# Patient Record
Sex: Female | Born: 1935 | Race: White | Hispanic: No | Marital: Single | Smoking: Never smoker
Health system: Southern US, Community
[De-identification: ages and names within clinical notes are randomized; demographics above are authoritative.]

## PROBLEM LIST (undated history)

## (undated) DIAGNOSIS — M6282 Rhabdomyolysis: Secondary | ICD-10-CM

## (undated) DIAGNOSIS — L8990 Pressure ulcer of unspecified site, unspecified stage: Secondary | ICD-10-CM

## (undated) DIAGNOSIS — R7989 Other specified abnormal findings of blood chemistry: Secondary | ICD-10-CM

## (undated) DIAGNOSIS — R011 Cardiac murmur, unspecified: Secondary | ICD-10-CM

## (undated) DIAGNOSIS — J189 Pneumonia, unspecified organism: Secondary | ICD-10-CM

## (undated) DIAGNOSIS — D649 Anemia, unspecified: Secondary | ICD-10-CM

## (undated) DIAGNOSIS — R9431 Abnormal electrocardiogram [ECG] [EKG]: Secondary | ICD-10-CM

## (undated) DIAGNOSIS — Z9181 History of falling: Secondary | ICD-10-CM

## (undated) HISTORY — DX: Other specified abnormal findings of blood chemistry: R79.89

## (undated) HISTORY — DX: Cardiac murmur, unspecified: R01.1

## (undated) HISTORY — DX: Anemia, unspecified: D64.9

## (undated) HISTORY — DX: Pneumonia, unspecified organism: J18.9

## (undated) HISTORY — DX: Abnormal electrocardiogram (ECG) (EKG): R94.31

## (undated) HISTORY — DX: Pressure ulcer of unspecified site, unspecified stage: L89.90

## (undated) HISTORY — DX: History of falling: Z91.81

## (undated) HISTORY — PX: OOPHORECTOMY: SHX86

## (undated) HISTORY — DX: Rhabdomyolysis: M62.82

## (undated) HISTORY — PX: CHOLECYSTECTOMY: SHX55

---

## 2016-03-03 ENCOUNTER — Inpatient Hospital Stay (HOSPITAL_COMMUNITY)
Admission: EM | Admit: 2016-03-03 | Discharge: 2016-03-07 | DRG: 564 | Disposition: A | Discharge status: Skilled Nursing Facility | Payer: Medicare Other | Attending: Internal Medicine | Admitting: Internal Medicine

## 2016-03-03 ENCOUNTER — Emergency Department (HOSPITAL_COMMUNITY): Payer: Medicare Other

## 2016-03-03 ENCOUNTER — Encounter (HOSPITAL_COMMUNITY): Payer: Self-pay | Admitting: Emergency Medicine

## 2016-03-03 DIAGNOSIS — J181 Lobar pneumonia, unspecified organism: Secondary | ICD-10-CM

## 2016-03-03 DIAGNOSIS — Z7982 Long term (current) use of aspirin: Secondary | ICD-10-CM

## 2016-03-03 DIAGNOSIS — L03116 Cellulitis of left lower limb: Secondary | ICD-10-CM | POA: Diagnosis present

## 2016-03-03 DIAGNOSIS — R9431 Abnormal electrocardiogram [ECG] [EKG]: Secondary | ICD-10-CM | POA: Diagnosis present

## 2016-03-03 DIAGNOSIS — Z9181 History of falling: Secondary | ICD-10-CM

## 2016-03-03 DIAGNOSIS — N895 Stricture and atresia of vagina: Secondary | ICD-10-CM | POA: Diagnosis present

## 2016-03-03 DIAGNOSIS — L309 Dermatitis, unspecified: Secondary | ICD-10-CM | POA: Diagnosis present

## 2016-03-03 DIAGNOSIS — J189 Pneumonia, unspecified organism: Secondary | ICD-10-CM

## 2016-03-03 DIAGNOSIS — Y92009 Unspecified place in unspecified non-institutional (private) residence as the place of occurrence of the external cause: Secondary | ICD-10-CM

## 2016-03-03 DIAGNOSIS — T796XXA Traumatic ischemia of muscle, initial encounter: Principal | ICD-10-CM | POA: Insufficient documentation

## 2016-03-03 DIAGNOSIS — R339 Retention of urine, unspecified: Secondary | ICD-10-CM | POA: Diagnosis present

## 2016-03-03 DIAGNOSIS — I248 Other forms of acute ischemic heart disease: Secondary | ICD-10-CM | POA: Diagnosis present

## 2016-03-03 DIAGNOSIS — R32 Unspecified urinary incontinence: Secondary | ICD-10-CM | POA: Diagnosis present

## 2016-03-03 DIAGNOSIS — I5033 Acute on chronic diastolic (congestive) heart failure: Secondary | ICD-10-CM | POA: Diagnosis present

## 2016-03-03 DIAGNOSIS — R0902 Hypoxemia: Secondary | ICD-10-CM

## 2016-03-03 DIAGNOSIS — Z9049 Acquired absence of other specified parts of digestive tract: Secondary | ICD-10-CM

## 2016-03-03 DIAGNOSIS — D649 Anemia, unspecified: Secondary | ICD-10-CM | POA: Diagnosis present

## 2016-03-03 DIAGNOSIS — M6282 Rhabdomyolysis: Secondary | ICD-10-CM | POA: Diagnosis present

## 2016-03-03 DIAGNOSIS — E876 Hypokalemia: Secondary | ICD-10-CM | POA: Diagnosis present

## 2016-03-03 DIAGNOSIS — S40211A Abrasion of right shoulder, initial encounter: Secondary | ICD-10-CM | POA: Diagnosis present

## 2016-03-03 DIAGNOSIS — D638 Anemia in other chronic diseases classified elsewhere: Secondary | ICD-10-CM | POA: Diagnosis present

## 2016-03-03 DIAGNOSIS — R778 Other specified abnormalities of plasma proteins: Secondary | ICD-10-CM | POA: Diagnosis present

## 2016-03-03 DIAGNOSIS — H9190 Unspecified hearing loss, unspecified ear: Secondary | ICD-10-CM | POA: Diagnosis present

## 2016-03-03 DIAGNOSIS — L8990 Pressure ulcer of unspecified site, unspecified stage: Secondary | ICD-10-CM | POA: Insufficient documentation

## 2016-03-03 DIAGNOSIS — I272 Other secondary pulmonary hypertension: Secondary | ICD-10-CM | POA: Diagnosis present

## 2016-03-03 DIAGNOSIS — R7989 Other specified abnormal findings of blood chemistry: Secondary | ICD-10-CM | POA: Diagnosis present

## 2016-03-03 DIAGNOSIS — L89152 Pressure ulcer of sacral region, stage 2: Secondary | ICD-10-CM | POA: Diagnosis present

## 2016-03-03 DIAGNOSIS — J9601 Acute respiratory failure with hypoxia: Secondary | ICD-10-CM | POA: Diagnosis present

## 2016-03-03 DIAGNOSIS — W19XXXA Unspecified fall, initial encounter: Secondary | ICD-10-CM | POA: Diagnosis present

## 2016-03-03 DIAGNOSIS — L89113 Pressure ulcer of right upper back, stage 3: Secondary | ICD-10-CM | POA: Diagnosis present

## 2016-03-03 DIAGNOSIS — M25552 Pain in left hip: Secondary | ICD-10-CM | POA: Diagnosis not present

## 2016-03-03 DIAGNOSIS — I350 Nonrheumatic aortic (valve) stenosis: Secondary | ICD-10-CM | POA: Diagnosis present

## 2016-03-03 DIAGNOSIS — L89892 Pressure ulcer of other site, stage 2: Secondary | ICD-10-CM | POA: Diagnosis present

## 2016-03-03 DIAGNOSIS — T7601XA Adult neglect or abandonment, suspected, initial encounter: Secondary | ICD-10-CM

## 2016-03-03 HISTORY — DX: Pneumonia, unspecified organism: J18.9

## 2016-03-03 MED ORDER — SODIUM CHLORIDE 0.9 % IV BOLUS (SEPSIS)
1000.0000 mL | Freq: Once | INTRAVENOUS | Status: AC
  Administered 2016-03-04: 1000 mL via INTRAVENOUS

## 2016-03-03 NOTE — ED Notes (Signed)
Bed: [id number] Expected date:  Expected time:  Means of arrival:  Comments: EMS 82yo F left arm pain / fall

## 2016-03-03 NOTE — ED Notes (Addendum)
Pt from home via EMS following a fall 2 days ago. Pt states that she lives by herself. 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 They state that [employer] will be in as this will be an APS case. Pt states that her daughter checks on her occasionally. Pt was found by an [employer] employee. Pt only has complaints of left arm pain. No deformity or swelling identified in the area. Pt can move the extremity without grimacing. Pt has no known medical history and EMS states that there were no medications in the home. Per EMS the home was infested with large bugs  Pt has large bruise on right leg. Pt has small abrasion on right shoulder

## 2016-03-03 NOTE — ED Notes (Signed)
PT taken to XRAY

## 2016-03-03 NOTE — ED Provider Notes (Addendum)
CSN: [id number]     Arrival date & time 03/03/16  2102 History  By signing my name below, I, [facility], attest that this documentation has been prepared under the direction and in the presence of Rontavious Albright, MD. Electronically Signed: Randell Patient, ED Scribe. 03/04/2016. 3:34 AM.   Chief Complaint  Patient presents with  . Fall    Patient is a 82 y.o. female presenting with fall. The history is provided by the patient and a relative. No language interpreter was used.  Fall This is a new problem. The current episode started more than 2 days ago. The problem occurs constantly. The problem has not changed since onset.Pertinent negatives include no chest pain. Nothing aggravates the symptoms. Nothing relieves the symptoms. She has tried nothing for the symptoms. The treatment provided no relief.   HPI Comments: Laura Sanchez is a 82 y.o. female with no pertinent chronic conditions who presents to the Emergency Department complaining of constant, mild neck pain and hip pain after a fall that occurred 3 days ago. Daughter reports that the pt fell 3 days ago and was found by the [employer] repairman earlier today. Daughter states that the pt was able to drag herself to next to an A/C vent and had a jug of water with her but that she has not eaten any food since the fall. Pt reports associated left arm pain, left neck pain, and left hip pain. Denies any other symptoms currently.  History reviewed. No pertinent past medical history. History reviewed. No pertinent past surgical history. No family history on file. Social History  Substance Use Topics  . Smoking status: Never Smoker   . Smokeless tobacco: None  . Alcohol Use: No   OB History    No data available     Review of Systems  Cardiovascular: Negative for chest pain.  Musculoskeletal: Positive for myalgias, arthralgias and neck pain.  All other systems reviewed and are negative.     Allergies  Review of patient's  allergies indicates no known allergies.  Home Medications   Prior to Admission medications   Medication Sig Start Date End Date Taking? Authorizing Provider  aspirin EC 325 MG tablet Take 325 mg by mouth daily as needed for moderate pain.   Yes Historical Provider, MD  Ca Phosphate-Cholecalciferol (CALTRATE GUMMY BITES) 250-400 MG-UNIT CHEW Chew 2 each by mouth daily.   Yes Historical Provider, MD  Menthol, Topical Analgesic, (BENGAY EX) Apply 1 application topically daily as needed (pain.).   Yes Historical Provider, MD  Multiple Vitamins-Minerals (MULTIVITAMIN WITH IRON-MINERALS) liquid Take 15 mLs by mouth daily.   Yes Historical Provider, MD   BP 150/59 mmHg  Pulse 60  Temp(Src) 97.5 F (36.4 C) (Oral)  Resp 13  SpO2 97% Physical Exam  Constitutional: She is oriented to person, place, and time. She appears well-developed and well-nourished. No distress.  HENT:  Head: Normocephalic and atraumatic. Head is without raccoon's eyes and without Battle's sign.  Right Ear: No hemotympanum.  Left Ear: No hemotympanum.  Mouth/Throat: Oropharynx is clear and moist and mucous membranes are normal. No oropharyngeal exudate.  Moist mucus membranes.  Eyes: EOM are normal. Pupils are equal, round, and reactive to light.  PERRL  Neck: Trachea normal and normal range of motion. Neck supple. Carotid bruit is not present. No tracheal deviation present.  No stridor or bruit. Trachea midline.  Cardiovascular: Normal rate, regular rhythm and intact distal pulses.   Regular rate and rhythm. Intact DP pulses.  Pulmonary/Chest: Effort  normal and breath sounds normal. No stridor. No respiratory distress. She has no wheezes. She has no rales.  Lungs CTA bilaterally.  Abdominal: Soft. Bowel sounds are normal. She exhibits no distension. There is no rebound and no guarding.  Bowel sounds normal. No guarding or rebound.  Musculoskeletal: Normal range of motion. She exhibits no edema.  Pelvis stable. No  external rotation for shortening. All compartments soft.  Neurological: She is alert and oriented to person, place, and time. She has normal reflexes.  Skin: Skin is warm and dry. She is not diaphoretic.  Early cellultis on left shin. No bruising on upper extremities.  Psychiatric: She has a normal mood and affect. Her behavior is normal.  Nursing note and vitals reviewed.   ED Course  Procedures (including critical care time)  DIAGNOSTIC STUDIES: Oxygen Saturation is 97% on RA, normal by my interpretation.    COORDINATION OF CARE: 11:23 PM Will order IV fluids, labs, CT scan of head and c-spine, and x-rays of left arm, left hip, and chest. Discussed treatment plan with pt at bedside and pt agreed to plan.  3:28 AM Spoke with pt's nurse who stated that 4 ED staff members have unsuccessfully attempted to in and out cath the pt.  Labs Review Labs Reviewed  CBC WITH DIFFERENTIAL/PLATELET - Abnormal; Notable for the following:    WBC 13.9 (*)    RBC 3.00 (*)    Hemoglobin 9.4 (*)    HCT 27.6 (*)    RDW 15.6 (*)    Neutro Abs 11.6 (*)    Monocytes Absolute 1.5 (*)    All other components within normal limits  CK TOTAL AND CKMB (NOT AT [facility]) - Abnormal; Notable for the following:    Total CK 758 (*)    CK, MB 13.9 (*)    All other components within normal limits  CK - Abnormal; Notable for the following:    Total CK 696 (*)    All other components within normal limits  I-STAT CHEM 8, ED - Abnormal; Notable for the following:    Sodium 133 (*)    Chloride 91 (*)    BUN 25 (*)    Calcium, Ion 1.07 (*)    Hemoglobin 9.5 (*)    HCT 28.0 (*)    All other components within normal limits  I-STAT TROPOININ, ED - Abnormal; Notable for the following:    Troponin i, poc 0.17 (*)    All other components within normal limits  URINALYSIS, ROUTINE W REFLEX MICROSCOPIC (NOT AT [facility])    Imaging Review Dg Chest 2 View  03/04/2016  CLINICAL DATA:  Status post fall, with pain at the left  humerus. Initial encounter. EXAM: CHEST  2 VIEW COMPARISON:  None. FINDINGS: A moderate right-sided pleural effusion is noted, with patchy bilateral airspace opacity. This may reflect pneumonia or pulmonary edema. No pneumothorax is seen. The cardiomediastinal silhouette remains normal in size. No acute osseous abnormalities are identified. There is diffuse osteopenia of visualized osseous structures. Clips are noted within the right upper quadrant, reflecting prior cholecystectomy. IMPRESSION: Moderate right-sided pleural effusion, with patchy bilateral airspace opacity. This may reflect pneumonia or pulmonary edema. Electronically Signed   By: Roanna RaiderJeffery  Chang M.D.   On: 03/04/2016 00:35   Ct Head Wo Contrast  03/04/2016  CLINICAL DATA:  82 year old female with fall EXAM: CT HEAD WITHOUT CONTRAST CT CERVICAL SPINE WITHOUT CONTRAST TECHNIQUE: Multidetector CT imaging of the head and cervical spine was performed following the standard protocol  without intravenous contrast. Multiplanar CT image reconstructions of the cervical spine were also generated. COMPARISON:  None. FINDINGS: CT HEAD FINDINGS The ventricles and sulci are mildly prominent compatible with mild age-related atrophy. Mild moderate periventricular and deep white matter chronic microvascular ischemic changes noted. There is no acute intracranial hemorrhage. No mass effect or midline shift noted. The visualized paranasal sinuses and mastoid air cells are clear. The calvarium is intact. CT CERVICAL SPINE FINDINGS There is no acute fracture or subluxation of the cervical spine.There is osteopenia with multilevel degenerative changes of the spine.The odontoid and spinous processes are intact.There is normal anatomic alignment of the C1-C2 lateral masses. The visualized soft tissues appear unremarkable. Partially visualized right and probable left pleural effusions. There is biapical scarring with increased interstitial septal prominence likely a degree of  edema. IMPRESSION: No acute intracranial hemorrhage. No acute/ traumatic cervical spine pathology. Electronically Signed   By: Elgie Collard M.D.   On: 03/04/2016 00:50   Ct Cervical Spine Wo Contrast  03/04/2016  CLINICAL DATA:  82 year old female with fall EXAM: CT HEAD WITHOUT CONTRAST CT CERVICAL SPINE WITHOUT CONTRAST TECHNIQUE: Multidetector CT imaging of the head and cervical spine was performed following the standard protocol without intravenous contrast. Multiplanar CT image reconstructions of the cervical spine were also generated. COMPARISON:  None. FINDINGS: CT HEAD FINDINGS The ventricles and sulci are mildly prominent compatible with mild age-related atrophy. Mild moderate periventricular and deep white matter chronic microvascular ischemic changes noted. There is no acute intracranial hemorrhage. No mass effect or midline shift noted. The visualized paranasal sinuses and mastoid air cells are clear. The calvarium is intact. CT CERVICAL SPINE FINDINGS There is no acute fracture or subluxation of the cervical spine.There is osteopenia with multilevel degenerative changes of the spine.The odontoid and spinous processes are intact.There is normal anatomic alignment of the C1-C2 lateral masses. The visualized soft tissues appear unremarkable. Partially visualized right and probable left pleural effusions. There is biapical scarring with increased interstitial septal prominence likely a degree of edema. IMPRESSION: No acute intracranial hemorrhage. No acute/ traumatic cervical spine pathology. Electronically Signed   By: Elgie Collard M.D.   On: 03/04/2016 00:50   Dg Humerus Left  03/04/2016  CLINICAL DATA:  82 year old female with fall and left upper extremity pain. EXAM: LEFT HUMERUS - 2+ VIEW COMPARISON:  None. FINDINGS: No definite acute fracture or dislocation identified. There is advanced osteopenia which limits evaluation for fracture. There is degenerative changes of the shoulder. The  soft tissues appear unremarkable. No radiopaque foreign object identified. IMPRESSION: No acute fracture or dislocation. Electronically Signed   By: Elgie Collard M.D.   On: 03/04/2016 00:36   Dg Hip Unilat With Pelvis 2-3 Views Left  03/04/2016  CLINICAL DATA:  Status post fall, with left hip pain. Initial encounter. EXAM: DG HIP (WITH OR WITHOUT PELVIS) 2-3V LEFT COMPARISON:  None. FINDINGS: There is no evidence of fracture or dislocation. Both femoral heads are seated normally within their respective acetabula. The proximal left femur appears intact. No significant degenerative change is appreciated. The sacroiliac joints are unremarkable in appearance. The visualized bowel gas pattern is grossly unremarkable in appearance. IMPRESSION: No evidence of fracture or dislocation. Electronically Signed   By: Roanna Raider M.D.   On: 03/04/2016 00:36   I have personally reviewed and evaluated these images and lab results as part of my medical decision-making.   EKG Interpretation   Date/Time:  Saturday March 04 2016 00:17:01 EDT Ventricular Rate:  61 PR  Interval:    QRS Duration: 80 QT Interval:  620 QTC Calculation: 625 R Axis:   99 Text Interpretation:  Sinus rhythm Atrial premature complexes Probable  anteroseptal infarct, old Prolonged QT interval Baseline wander in lead(s)  V4 V5 Confirmed by [facility]  MD, Royalti Schauf ([id number]) on 03/04/2016 1:22:49  AM      MDM   Final diagnoses:  Fall   Filed Vitals:   03/04/16 0330 03/04/16 0334  BP: 164/73   Pulse:  70  Temp:    Resp:  20   Results for orders placed or performed during the hospital encounter of 03/03/16  CBC with Differential/Platelet  Result Value Ref Range   WBC 13.9 (H) 4.0 - 10.5 K/uL   RBC 3.00 (L) 3.87 - 5.11 MIL/uL   Hemoglobin 9.4 (L) 12.0 - 15.0 g/dL   HCT 60.4 (L) 54.0 - 98.1 %   MCV 92.0 78.0 - 100.0 fL   MCH 31.3 26.0 - 34.0 pg   MCHC 34.1 30.0 - 36.0 g/dL   RDW 19.1 (H) 47.8 - 29.5 %   Platelets 292 150  - 400 K/uL   Neutrophils Relative % 84 %   Neutro Abs 11.6 (H) 1.7 - 7.7 K/uL   Lymphocytes Relative 5 %   Lymphs Abs 0.7 0.7 - 4.0 K/uL   Monocytes Relative 11 %   Monocytes Absolute 1.5 (H) 0.1 - 1.0 K/uL   Eosinophils Relative 0 %   Eosinophils Absolute 0.0 0.0 - 0.7 K/uL   Basophils Relative 0 %   Basophils Absolute 0.0 0.0 - 0.1 K/uL  Urinalysis, Routine w reflex microscopic (not at [facility])  Result Value Ref Range   Color, Urine AMBER (A) YELLOW   APPearance CLEAR CLEAR   Specific Gravity, Urine 1.019 1.005 - 1.030   pH 6.5 5.0 - 8.0   Glucose, UA NEGATIVE NEGATIVE mg/dL   Hgb urine dipstick MODERATE (A) NEGATIVE   Bilirubin Urine SMALL (A) NEGATIVE   Ketones, ur 40 (A) NEGATIVE mg/dL   Protein, ur NEGATIVE NEGATIVE mg/dL   Nitrite NEGATIVE NEGATIVE   Leukocytes, UA SMALL (A) NEGATIVE  CK total and CKMB (cardiac)not at [facility]  Result Value Ref Range   Total CK 758 (H) 38 - 234 U/L   CK, MB 13.9 (H) 0.5 - 5.0 ng/mL   Relative Index 1.8 0.0 - 2.5  CK  Result Value Ref Range   Total CK 696 (H) 38 - 234 U/L  Urine microscopic-add on  Result Value Ref Range   Squamous Epithelial / LPF 0-5 (A) NONE SEEN   WBC, UA 0-5 0 - 5 WBC/hpf   RBC / HPF 6-30 0 - 5 RBC/hpf   Bacteria, UA RARE (A) NONE SEEN  I-Stat Chem 8, ED  Result Value Ref Range   Sodium 133 (L) 135 - 145 mmol/L   Potassium 3.5 3.5 - 5.1 mmol/L   Chloride 91 (L) 101 - 111 mmol/L   BUN 25 (H) 6 - 20 mg/dL   Creatinine, Ser 6.21 0.44 - 1.00 mg/dL   Glucose, Bld 96 65 - 99 mg/dL   Calcium, Ion 3.08 (L) 1.13 - 1.30 mmol/L   TCO2 28 0 - 100 mmol/L   Hemoglobin 9.5 (L) 12.0 - 15.0 g/dL   HCT 65.7 (L) 84.6 - 96.2 %  I-stat troponin, ED  Result Value Ref Range   Troponin i, poc 0.17 (HH) 0.00 - 0.08 ng/mL   Comment NOTIFIED PHYSICIAN    Comment 3  Dg Chest 2 View  03/04/2016  CLINICAL DATA:  Status post fall, with pain at the left humerus. Initial encounter. EXAM: CHEST  2 VIEW COMPARISON:  None. FINDINGS:  A moderate right-sided pleural effusion is noted, with patchy bilateral airspace opacity. This may reflect pneumonia or pulmonary edema. No pneumothorax is seen. The cardiomediastinal silhouette remains normal in size. No acute osseous abnormalities are identified. There is diffuse osteopenia of visualized osseous structures. Clips are noted within the right upper quadrant, reflecting prior cholecystectomy. IMPRESSION: Moderate right-sided pleural effusion, with patchy bilateral airspace opacity. This may reflect pneumonia or pulmonary edema. Electronically Signed   By: Roanna Raider M.D.   On: 03/04/2016 00:35   Ct Head Wo Contrast  03/04/2016  CLINICAL DATA:  82 year old female with fall EXAM: CT HEAD WITHOUT CONTRAST CT CERVICAL SPINE WITHOUT CONTRAST TECHNIQUE: Multidetector CT imaging of the head and cervical spine was performed following the standard protocol without intravenous contrast. Multiplanar CT image reconstructions of the cervical spine were also generated. COMPARISON:  None. FINDINGS: CT HEAD FINDINGS The ventricles and sulci are mildly prominent compatible with mild age-related atrophy. Mild moderate periventricular and deep white matter chronic microvascular ischemic changes noted. There is no acute intracranial hemorrhage. No mass effect or midline shift noted. The visualized paranasal sinuses and mastoid air cells are clear. The calvarium is intact. CT CERVICAL SPINE FINDINGS There is no acute fracture or subluxation of the cervical spine.There is osteopenia with multilevel degenerative changes of the spine.The odontoid and spinous processes are intact.There is normal anatomic alignment of the C1-C2 lateral masses. The visualized soft tissues appear unremarkable. Partially visualized right and probable left pleural effusions. There is biapical scarring with increased interstitial septal prominence likely a degree of edema. IMPRESSION: No acute intracranial hemorrhage. No acute/ traumatic  cervical spine pathology. Electronically Signed   By: Elgie Collard M.D.   On: 03/04/2016 00:50   Ct Cervical Spine Wo Contrast  03/04/2016  CLINICAL DATA:  82 year old female with fall EXAM: CT HEAD WITHOUT CONTRAST CT CERVICAL SPINE WITHOUT CONTRAST TECHNIQUE: Multidetector CT imaging of the head and cervical spine was performed following the standard protocol without intravenous contrast. Multiplanar CT image reconstructions of the cervical spine were also generated. COMPARISON:  None. FINDINGS: CT HEAD FINDINGS The ventricles and sulci are mildly prominent compatible with mild age-related atrophy. Mild moderate periventricular and deep white matter chronic microvascular ischemic changes noted. There is no acute intracranial hemorrhage. No mass effect or midline shift noted. The visualized paranasal sinuses and mastoid air cells are clear. The calvarium is intact. CT CERVICAL SPINE FINDINGS There is no acute fracture or subluxation of the cervical spine.There is osteopenia with multilevel degenerative changes of the spine.The odontoid and spinous processes are intact.There is normal anatomic alignment of the C1-C2 lateral masses. The visualized soft tissues appear unremarkable. Partially visualized right and probable left pleural effusions. There is biapical scarring with increased interstitial septal prominence likely a degree of edema. IMPRESSION: No acute intracranial hemorrhage. No acute/ traumatic cervical spine pathology. Electronically Signed   By: Elgie Collard M.D.   On: 03/04/2016 00:50   Dg Humerus Left  03/04/2016  CLINICAL DATA:  82 year old female with fall and left upper extremity pain. EXAM: LEFT HUMERUS - 2+ VIEW COMPARISON:  None. FINDINGS: No definite acute fracture or dislocation identified. There is advanced osteopenia which limits evaluation for fracture. There is degenerative changes of the shoulder. The soft tissues appear unremarkable. No radiopaque foreign object identified.  IMPRESSION: No acute fracture or  dislocation. Electronically Signed   By: Elgie CollardArash  Radparvar M.D.   On: 03/04/2016 00:36   Dg Hip Unilat With Pelvis 2-3 Views Left  03/04/2016  CLINICAL DATA:  Status post fall, with left hip pain. Initial encounter. EXAM: DG HIP (WITH OR WITHOUT PELVIS) 2-3V LEFT COMPARISON:  None. FINDINGS: There is no evidence of fracture or dislocation. Both femoral heads are seated normally within their respective acetabula. The proximal left femur appears intact. No significant degenerative change is appreciated. The sacroiliac joints are unremarkable in appearance. The visualized bowel gas pattern is grossly unremarkable in appearance. IMPRESSION: No evidence of fracture or dislocation. Electronically Signed   By: Roanna RaiderJeffery  Chang M.D.   On: 03/04/2016 00:36    Medications  sodium chloride 0.9 % bolus 1,000 mL (0 mLs Intravenous Stopped 03/04/16 0334)  cefTRIAXone (ROCEPHIN) 1 g in dextrose 5 % 50 mL IVPB (0 g Intravenous Stopped 03/04/16 0333)  azithromycin (ZITHROMAX) 500 mg in dextrose 5 % 250 mL IVPB (500 mg Intravenous New Bag/Given 03/04/16 0333)    EKG Interpretation  Date/Time:  Saturday March 04 2016 00:17:01 EDT Ventricular Rate:  61 PR Interval:    QRS Duration: 80 QT Interval:  620 QTC Calculation: 625 R Axis:   99 Text Interpretation:  Sinus rhythm Atrial premature complexes Probable anteroseptal infarct, old Prolonged QT interval Baseline wander in lead(s) V4 V5 Confirmed by [facility]  [facility], Keontre Defino ([id number]) on 03/04/2016 1:22:49 AM       Will admit to inpatient.  Will need social work consult for placement   I personally performed the services described in this documentation, which was scribed in my presence. The recorded information has been reviewed and is accurate.      Cy BlamerApril Rasheed Welty, MD 03/04/16 0435  Shatonia Hoots, MD 03/04/16 [id number]

## 2016-03-03 NOTE — Progress Notes (Signed)
CSW met with patient at bedside. Patient was alert and oriented. Daughter was present. Patient confirms that she presents to [facility] due to fall. Patient states that she lives home alone and has been on the floor since she fell Wednesday. Patient informed CSW that she that she does not usually fall often. Patient states that she fell x2 within the past 6 months, and those 2 times were last Friday and Wednesday.  Daughter reports that patient has a good memory and is high functioning. Daughter states that pt has her own daily routine. Daughter states " She was doing great. She's more alert than I am". CSW questioned daughter about patient's living conditions. Daughter states that pt has a clear walk way, and patient states that she did not trip on anything , and states that it was something on her shoe. Daughter informed CSW that pt has heat, air, food, and water in her home. Daughter states that she is the patient's primary support.  Daughter states that she visits the patient once a week, usually on Tuesday and that patient's brother visits her x2 a week. Patient states that prior to fall she has been completing her ADL's independently prior to this incident.   Per note, EMS states that pt's home was infested with bugs and pt is a Ship broker. 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  Daughter/Jean Trapp [phone]  Willette Brace [phone] ED CSW 03/03/2016 10:59 PM

## 2016-03-03 NOTE — ED Notes (Signed)
Orlean PattenJean Tripp (daughter) [phone] Call in case of emergency or for updates.

## 2016-03-04 ENCOUNTER — Observation Stay (HOSPITAL_COMMUNITY): Payer: Medicare Other

## 2016-03-04 ENCOUNTER — Other Ambulatory Visit: Payer: Self-pay

## 2016-03-04 ENCOUNTER — Encounter (HOSPITAL_COMMUNITY): Payer: Self-pay | Admitting: Emergency Medicine

## 2016-03-04 ENCOUNTER — Inpatient Hospital Stay (HOSPITAL_COMMUNITY): Payer: Medicare Other

## 2016-03-04 DIAGNOSIS — Z7982 Long term (current) use of aspirin: Secondary | ICD-10-CM | POA: Diagnosis not present

## 2016-03-04 DIAGNOSIS — D649 Anemia, unspecified: Secondary | ICD-10-CM | POA: Diagnosis not present

## 2016-03-04 DIAGNOSIS — R339 Retention of urine, unspecified: Secondary | ICD-10-CM | POA: Diagnosis present

## 2016-03-04 DIAGNOSIS — L309 Dermatitis, unspecified: Secondary | ICD-10-CM | POA: Diagnosis present

## 2016-03-04 DIAGNOSIS — L89152 Pressure ulcer of sacral region, stage 2: Secondary | ICD-10-CM | POA: Diagnosis present

## 2016-03-04 DIAGNOSIS — W19XXXA Unspecified fall, initial encounter: Secondary | ICD-10-CM | POA: Diagnosis present

## 2016-03-04 DIAGNOSIS — R7989 Other specified abnormal findings of blood chemistry: Secondary | ICD-10-CM | POA: Diagnosis not present

## 2016-03-04 DIAGNOSIS — L89113 Pressure ulcer of right upper back, stage 3: Secondary | ICD-10-CM | POA: Diagnosis present

## 2016-03-04 DIAGNOSIS — I350 Nonrheumatic aortic (valve) stenosis: Secondary | ICD-10-CM

## 2016-03-04 DIAGNOSIS — L8990 Pressure ulcer of unspecified site, unspecified stage: Secondary | ICD-10-CM | POA: Diagnosis not present

## 2016-03-04 DIAGNOSIS — Z9049 Acquired absence of other specified parts of digestive tract: Secondary | ICD-10-CM | POA: Diagnosis not present

## 2016-03-04 DIAGNOSIS — Y92009 Unspecified place in unspecified non-institutional (private) residence as the place of occurrence of the external cause: Secondary | ICD-10-CM | POA: Diagnosis not present

## 2016-03-04 DIAGNOSIS — D638 Anemia in other chronic diseases classified elsewhere: Secondary | ICD-10-CM | POA: Diagnosis present

## 2016-03-04 DIAGNOSIS — I5033 Acute on chronic diastolic (congestive) heart failure: Secondary | ICD-10-CM | POA: Diagnosis present

## 2016-03-04 DIAGNOSIS — N895 Stricture and atresia of vagina: Secondary | ICD-10-CM | POA: Diagnosis present

## 2016-03-04 DIAGNOSIS — R9431 Abnormal electrocardiogram [ECG] [EKG]: Secondary | ICD-10-CM | POA: Diagnosis present

## 2016-03-04 DIAGNOSIS — E876 Hypokalemia: Secondary | ICD-10-CM | POA: Diagnosis present

## 2016-03-04 DIAGNOSIS — R32 Unspecified urinary incontinence: Secondary | ICD-10-CM | POA: Diagnosis present

## 2016-03-04 DIAGNOSIS — I248 Other forms of acute ischemic heart disease: Secondary | ICD-10-CM | POA: Diagnosis present

## 2016-03-04 DIAGNOSIS — M6282 Rhabdomyolysis: Secondary | ICD-10-CM | POA: Diagnosis present

## 2016-03-04 DIAGNOSIS — J9601 Acute respiratory failure with hypoxia: Secondary | ICD-10-CM | POA: Diagnosis present

## 2016-03-04 DIAGNOSIS — Z9181 History of falling: Secondary | ICD-10-CM | POA: Diagnosis not present

## 2016-03-04 DIAGNOSIS — T796XXA Traumatic ischemia of muscle, initial encounter: Secondary | ICD-10-CM | POA: Diagnosis present

## 2016-03-04 DIAGNOSIS — I272 Other secondary pulmonary hypertension: Secondary | ICD-10-CM | POA: Diagnosis present

## 2016-03-04 DIAGNOSIS — R778 Other specified abnormalities of plasma proteins: Secondary | ICD-10-CM | POA: Diagnosis present

## 2016-03-04 DIAGNOSIS — J189 Pneumonia, unspecified organism: Secondary | ICD-10-CM | POA: Diagnosis present

## 2016-03-04 DIAGNOSIS — H9190 Unspecified hearing loss, unspecified ear: Secondary | ICD-10-CM | POA: Diagnosis present

## 2016-03-04 DIAGNOSIS — S40211A Abrasion of right shoulder, initial encounter: Secondary | ICD-10-CM | POA: Diagnosis present

## 2016-03-04 DIAGNOSIS — T7601XA Adult neglect or abandonment, suspected, initial encounter: Secondary | ICD-10-CM | POA: Diagnosis present

## 2016-03-04 DIAGNOSIS — M25552 Pain in left hip: Secondary | ICD-10-CM | POA: Diagnosis present

## 2016-03-04 DIAGNOSIS — I4581 Long QT syndrome: Secondary | ICD-10-CM

## 2016-03-04 DIAGNOSIS — L03116 Cellulitis of left lower limb: Secondary | ICD-10-CM | POA: Diagnosis present

## 2016-03-04 DIAGNOSIS — L89892 Pressure ulcer of other site, stage 2: Secondary | ICD-10-CM | POA: Diagnosis present

## 2016-03-04 HISTORY — DX: Rhabdomyolysis: M62.82

## 2016-03-04 HISTORY — DX: Pressure ulcer of unspecified site, unspecified stage: L89.90

## 2016-03-04 HISTORY — DX: Abnormal electrocardiogram (ECG) (EKG): R94.31

## 2016-03-04 HISTORY — DX: Other specified abnormalities of plasma proteins: R77.8

## 2016-03-04 LAB — CBC
HCT: 28.7 % — ABNORMAL LOW (ref 36.0–46.0)
HEMOGLOBIN: 9.7 g/dL — AB (ref 12.0–15.0)
MCH: 30.3 pg (ref 26.0–34.0)
MCHC: 33.8 g/dL (ref 30.0–36.0)
MCV: 89.7 fL (ref 78.0–100.0)
PLATELETS: 298 10*3/uL (ref 150–400)
RBC: 3.2 MIL/uL — AB (ref 3.87–5.11)
RDW: 15.5 % (ref 11.5–15.5)
WBC: 12.1 10*3/uL — ABNORMAL HIGH (ref 4.0–10.5)

## 2016-03-04 LAB — ECHOCARDIOGRAM COMPLETE
AV Area VTI: 0.53 cm2
AV Mean grad: 74 mmHg
AV Peak grad: 122 mmHg
AV area mean vel ind: 0.34 cm2/m2
AVAREAMEANV: 0.51 cm2
AVAREAVTIIND: 0.32 cm2/m2
AVCELMEANRAT: 0.16
AVPKVEL: 553 cm/s
Ao pk vel: 0.17 m/s
CHL CUP AV PEAK INDEX: 0.36
CHL CUP AV VEL: 0.47
CHL CUP DOP CALC LVOT VTI: 20.7 cm
CHL CUP TV REG PEAK VELOCITY: 368 cm/s
DOP CAL AO MEAN VELOCITY: 404 cm/s
EERAT: 13.48
EWDT: 182 ms
FS: 27 % — AB (ref 28–44)
HEIGHTINCHES: 64 in
IVS/LV PW RATIO, ED: 0.72
LA ID, A-P, ES: 33 mm
LA diam end sys: 33 mm
LA diam index: 2.21 cm/m2
LA vol A4C: 97.1 ml
LA vol index: 57.8 mL/m2
LA vol: 86.1 mL
LDCA: 3.14 cm2
LV TDI E'LATERAL: 8.16
LV TDI E'MEDIAL: 7.07
LV e' LATERAL: 8.16 cm/s
LVEEAVG: 13.48
LVEEMED: 13.48
LVOT SV: 65 mL
LVOTD: 20 mm
LVOTPV: 94.1 cm/s
LVOTVTI: 0.15 cm
MV Dec: 182
MV Peak grad: 5 mmHg
MV VTI: 251 cm
MV pk A vel: 118 m/s
MVPKEVEL: 110 m/s
PW: 16.3 mm — AB (ref 0.6–1.1)
RV TAPSE: 22.8 mm
TRMAXVEL: 368 cm/s
VTI: 137 cm
Valve area index: 0.32
Valve area: 0.47 cm2
Weight: 1689.6 oz

## 2016-03-04 LAB — CBC WITH DIFFERENTIAL/PLATELET
BASOS ABS: 0 10*3/uL (ref 0.0–0.1)
BASOS PCT: 0 %
EOS ABS: 0 10*3/uL (ref 0.0–0.7)
EOS PCT: 0 %
HEMATOCRIT: 27.6 % — AB (ref 36.0–46.0)
Hemoglobin: 9.4 g/dL — ABNORMAL LOW (ref 12.0–15.0)
Lymphocytes Relative: 5 %
Lymphs Abs: 0.7 10*3/uL (ref 0.7–4.0)
MCH: 31.3 pg (ref 26.0–34.0)
MCHC: 34.1 g/dL (ref 30.0–36.0)
MCV: 92 fL (ref 78.0–100.0)
MONO ABS: 1.5 10*3/uL — AB (ref 0.1–1.0)
MONOS PCT: 11 %
NEUTROS ABS: 11.6 10*3/uL — AB (ref 1.7–7.7)
Neutrophils Relative %: 84 %
PLATELETS: 292 10*3/uL (ref 150–400)
RBC: 3 MIL/uL — ABNORMAL LOW (ref 3.87–5.11)
RDW: 15.6 % — AB (ref 11.5–15.5)
WBC: 13.9 10*3/uL — ABNORMAL HIGH (ref 4.0–10.5)

## 2016-03-04 LAB — COMPREHENSIVE METABOLIC PANEL
ALBUMIN: 2.8 g/dL — AB (ref 3.5–5.0)
ALT: 29 U/L (ref 14–54)
AST: 55 U/L — ABNORMAL HIGH (ref 15–41)
Alkaline Phosphatase: 45 U/L (ref 38–126)
Anion gap: 8 (ref 5–15)
BUN: 24 mg/dL — ABNORMAL HIGH (ref 6–20)
CHLORIDE: 97 mmol/L — AB (ref 101–111)
CO2: 28 mmol/L (ref 22–32)
CREATININE: 0.68 mg/dL (ref 0.44–1.00)
Calcium: 7.9 mg/dL — ABNORMAL LOW (ref 8.9–10.3)
GFR calc non Af Amer: >60 mL/min (ref >60)
GLUCOSE: 118 mg/dL — AB (ref 65–99)
Potassium: 3.1 mmol/L — ABNORMAL LOW (ref 3.5–5.1)
SODIUM: 133 mmol/L — AB (ref 135–145)
Total Bilirubin: 2.3 mg/dL — ABNORMAL HIGH (ref 0.3–1.2)
Total Protein: 5.6 g/dL — ABNORMAL LOW (ref 6.5–8.1)

## 2016-03-04 LAB — URINALYSIS, ROUTINE W REFLEX MICROSCOPIC
GLUCOSE, UA: NEGATIVE mg/dL
KETONES UR: 40 mg/dL — AB
Nitrite: NEGATIVE
PROTEIN: NEGATIVE mg/dL
Specific Gravity, Urine: 1.019 (ref 1.005–1.030)
pH: 6.5 (ref 5.0–8.0)

## 2016-03-04 LAB — TYPE AND SCREEN
ABO/RH(D): O POS
ANTIBODY SCREEN: NEGATIVE

## 2016-03-04 LAB — I-STAT CHEM 8, ED
BUN: 25 mg/dL — ABNORMAL HIGH (ref 6–20)
CREATININE: 0.9 mg/dL (ref 0.44–1.00)
Calcium, Ion: 1.07 mmol/L — ABNORMAL LOW (ref 1.13–1.30)
Chloride: 91 mmol/L — ABNORMAL LOW (ref 101–111)
Glucose, Bld: 96 mg/dL (ref 65–99)
HEMATOCRIT: 28 % — AB (ref 36.0–46.0)
HEMOGLOBIN: 9.5 g/dL — AB (ref 12.0–15.0)
POTASSIUM: 3.5 mmol/L (ref 3.5–5.1)
Sodium: 133 mmol/L — ABNORMAL LOW (ref 135–145)
TCO2: 28 mmol/L (ref 0–100)

## 2016-03-04 LAB — URINE MICROSCOPIC-ADD ON

## 2016-03-04 LAB — TSH: TSH: 4.759 u[IU]/mL — ABNORMAL HIGH (ref 0.350–4.500)

## 2016-03-04 LAB — I-STAT TROPONIN, ED: Troponin i, poc: 0.17 ng/mL (ref 0.00–0.08)

## 2016-03-04 LAB — TROPONIN I
TROPONIN I: 0.19 ng/mL — AB (ref <0.031)
Troponin I: 0.22 ng/mL — ABNORMAL HIGH (ref <0.031)
Troponin I: 0.23 ng/mL — ABNORMAL HIGH (ref <0.031)

## 2016-03-04 LAB — IRON AND TIBC
Iron: 34 ug/dL (ref 28–170)
SATURATION RATIOS: 15 % (ref 10.4–31.8)
TIBC: 230 ug/dL — AB (ref 250–450)
UIBC: 196 ug/dL

## 2016-03-04 LAB — ABO/RH: ABO/RH(D): O POS

## 2016-03-04 LAB — CK TOTAL AND CKMB (NOT AT ARMC)
CK TOTAL: 758 U/L — AB (ref 38–234)
CK, MB: 13.9 ng/mL — ABNORMAL HIGH (ref 0.5–5.0)
RELATIVE INDEX: 1.8 (ref 0.0–2.5)

## 2016-03-04 LAB — PROCALCITONIN: Procalcitonin: 0.27 ng/mL

## 2016-03-04 LAB — CK: CK TOTAL: 696 U/L — AB (ref 38–234)

## 2016-03-04 LAB — VITAMIN B12: VITAMIN B 12: 765 pg/mL (ref 180–914)

## 2016-03-04 LAB — MAGNESIUM: MAGNESIUM: 1.9 mg/dL (ref 1.7–2.4)

## 2016-03-04 MED ORDER — FUROSEMIDE 10 MG/ML IJ SOLN
20.0000 mg | Freq: Once | INTRAMUSCULAR | Status: AC
  Administered 2016-03-04: 20 mg via INTRAVENOUS
  Filled 2016-03-04: qty 2

## 2016-03-04 MED ORDER — PROCHLORPERAZINE EDISYLATE 5 MG/ML IJ SOLN: 5.0000 mg | Freq: Four times a day (QID) | INTRAMUSCULAR | Status: DC | PRN

## 2016-03-04 MED ORDER — PROCHLORPERAZINE EDISYLATE 5 MG/ML IJ SOLN: 10.0000 mg | Freq: Four times a day (QID) | INTRAMUSCULAR | Status: DC | PRN

## 2016-03-04 MED ORDER — ALBUTEROL SULFATE (2.5 MG/3ML) 0.083% IN NEBU: 2.5000 mg | INHALATION_SOLUTION | RESPIRATORY_TRACT | Status: AC

## 2016-03-04 MED ORDER — ALBUTEROL SULFATE (2.5 MG/3ML) 0.083% IN NEBU: 2.5000 mg | INHALATION_SOLUTION | Freq: Four times a day (QID) | RESPIRATORY_TRACT | Status: DC | PRN

## 2016-03-04 MED ORDER — ADULT MULTIVITAMIN LIQUID CH
15.0000 mL | Freq: Every day | ORAL | Status: DC
  Administered 2016-03-04 – 2016-03-07 (×4): 15 mL via ORAL
  Filled 2016-03-04 (×4): qty 15

## 2016-03-04 MED ORDER — POTASSIUM CHLORIDE CRYS ER 20 MEQ PO TBCR
20.0000 meq | EXTENDED_RELEASE_TABLET | Freq: Once | ORAL | Status: AC
  Administered 2016-03-04: 20 meq via ORAL
  Filled 2016-03-04: qty 1

## 2016-03-04 MED ORDER — SODIUM CHLORIDE 0.9 % IV SOLN: INTRAVENOUS | Status: DC

## 2016-03-04 MED ORDER — AZITHROMYCIN 500 MG IV SOLR
500.0000 mg | Freq: Once | INTRAVENOUS | Status: AC
  Administered 2016-03-04: 500 mg via INTRAVENOUS
  Filled 2016-03-04: qty 500

## 2016-03-04 MED ORDER — DEXTROSE 5 % IV SOLN
1.0000 g | INTRAVENOUS | Status: DC
  Administered 2016-03-04 – 2016-03-06 (×3): 1 g via INTRAVENOUS
  Filled 2016-03-04 (×4): qty 10

## 2016-03-04 MED ORDER — GUAIFENESIN ER 600 MG PO TB12
600.0000 mg | ORAL_TABLET | Freq: Two times a day (BID) | ORAL | Status: DC
  Administered 2016-03-04 – 2016-03-06 (×4): 600 mg via ORAL
  Filled 2016-03-04 (×7): qty 1

## 2016-03-04 MED ORDER — SODIUM CHLORIDE 0.9% FLUSH
3.0000 mL | Freq: Two times a day (BID) | INTRAVENOUS | Status: DC
  Administered 2016-03-04 – 2016-03-07 (×6): 3 mL via INTRAVENOUS

## 2016-03-04 MED ORDER — POTASSIUM CHLORIDE CRYS ER 20 MEQ PO TBCR
40.0000 meq | EXTENDED_RELEASE_TABLET | Freq: Once | ORAL | Status: AC
  Administered 2016-03-04: 40 meq via ORAL
  Filled 2016-03-04: qty 2

## 2016-03-04 MED ORDER — ALBUTEROL SULFATE (2.5 MG/3ML) 0.083% IN NEBU: 2.5000 mg | INHALATION_SOLUTION | RESPIRATORY_TRACT | Status: DC | PRN

## 2016-03-04 MED ORDER — DEXTROSE 5 % IV SOLN
1.0000 g | Freq: Once | INTRAVENOUS | Status: AC
  Administered 2016-03-04: 1 g via INTRAVENOUS
  Filled 2016-03-04: qty 10

## 2016-03-04 MED ORDER — ENOXAPARIN SODIUM 40 MG/0.4ML ~~LOC~~ SOLN
40.0000 mg | SUBCUTANEOUS | Status: DC
  Administered 2016-03-04 – 2016-03-07 (×4): 40 mg via SUBCUTANEOUS
  Filled 2016-03-04 (×4): qty 0.4

## 2016-03-04 MED ORDER — AZITHROMYCIN 250 MG PO TABS
500.0000 mg | ORAL_TABLET | Freq: Every day | ORAL | Status: DC
  Administered 2016-03-04 – 2016-03-07 (×4): 500 mg via ORAL
  Filled 2016-03-04 (×5): qty 2

## 2016-03-04 MED ORDER — ALUM & MAG HYDROXIDE-SIMETH 200-200-20 MG/5ML PO SUSP
15.0000 mL | ORAL | Status: DC | PRN
  Administered 2016-03-04: 15 mL via ORAL
  Filled 2016-03-04: qty 30

## 2016-03-04 MED ORDER — SODIUM CHLORIDE 0.9 % IV SOLN: Freq: Once | INTRAVENOUS | Status: DC

## 2016-03-04 NOTE — ED Notes (Signed)
Pt cTnl is 0.17 EDP [place] made aware.

## 2016-03-04 NOTE — Progress Notes (Signed)
*  PRELIMINARY RESULTS* Echocardiogram 2D Echocardiogram has been performed.  Jeryl Columbialliott, Solenne Manwarren 03/04/2016, 3:23 PM

## 2016-03-04 NOTE — ED Notes (Signed)
4 ED staff members attempted to in and out cath pt. Pt has had vaginal reconstructive surgery. Attempts to cath patient were unsuccessful. Pt has in bladder. A U-Bag was placed on pt.
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
## 2016-03-04 NOTE — Consult Note (Signed)
Urology Consult   Physician requesting consult: Regalado  Reason for consult: Urinary retention, difficult catheter placement  History of Present Illness: Laura Sanchez is a 82 y.o. female who was admitted earlier today with recent history of having fallen 2-3 days ago, and she was just found by serviceman. She is admitted for management of trauma. Since her admission, she has been unable to void. She has a history of vaginal surgery years ago. Despite multiple attempts by the nursing staff, catheter placement was unsuccessful due to her significant vaginal stenosis. Urologic consultation is requested.  The patient is interactive/responsive. She states that she usually has a slow stream. She denies frequent urinary tract infections. She denies urologic consultation recently.  History reviewed. No pertinent past medical history.  History reviewed. No pertinent past surgical history.   Current Hospital Medications: Scheduled Meds: . sodium chloride   Intravenous Once  . albuterol  2.5 mg Nebulization STAT  . azithromycin  500 mg Oral Daily  . cefTRIAXone (ROCEPHIN)  IV  1 g Intravenous Q24H  . enoxaparin (LOVENOX) injection  40 mg Subcutaneous Q24H  . furosemide  20 mg Intravenous Once  . guaiFENesin  600 mg Oral BID  . multivitamin  15 mL Oral Daily  . sodium chloride flush  3 mL Intravenous Q12H   Continuous Infusions:  PRN Meds:.albuterol, prochlorperazine  Allergies: No Known Allergies  History reviewed. No pertinent family history.  Social History:  reports that she has never smoked. She does not have any smokeless tobacco history on file. She reports that she does not drink alcohol. Her drug history is not on file.  ROS: A complete review of systems was performed.  All systems are negative except for pertinent findings as noted.  Physical Exam:  Vital signs in last 24 hours: Temp:  [97.5 F (36.4 C)-98.2 F (36.8 C)] 97.8 F (36.6 C) (06/24 1305) Pulse Rate:  [59-70]  70 (06/24 1305) Resp:  [13-20] 20 (06/24 0600) BP: (135-168)/(48-73) 135/48 mmHg (06/24 1305) SpO2:  [96 %-97 %] 97 % (06/24 1305) Weight:  [47.9 kg (105 lb 9.6 oz)] 47.9 kg (105 lb 9.6 oz) (06/24 0545) General:  Alert and oriented, No acute distress. She is somewhat hard of hearing. HEENT: Normocephalic, atraumatic Neck: No JVD or lymphadenopathy Cardiovascular: Regular rate and rhythm Abdomen: Soft, nontender, nondistended, no abdominal masses. Suprapubic fullness noted. Back: No CVA tenderness Extremities: Right ecchymosis/edema present. Neurologic: Grossly intact  Laboratory Data:   Recent Labs  03/03/16 0030 03/04/16 0039 03/04/16 0625  WBC 13.9*  --  12.1*  HGB 9.4* 9.5* 9.7*  HCT 27.6* 28.0* 28.7*  PLT 292  --  298     Recent Labs  03/04/16 0039 03/04/16 0625  NA 133* 133*  K 3.5 3.1*  CL 91* 97*  GLUCOSE 96 118*  BUN 25* 24*  CALCIUM  --  7.9*  CREATININE 0.90 0.68     Results for orders placed or performed during the hospital encounter of 03/03/16 (from the past 24 hour(s))  I-Stat Chem 8, ED     Status: Abnormal   Collection Time: 03/04/16 12:39 AM  Result Value Ref Range   Sodium 133 (L) 135 - 145 mmol/L   Potassium 3.5 3.5 - 5.1 mmol/L   Chloride 91 (L) 101 - 111 mmol/L   BUN 25 (H) 6 - 20 mg/dL   Creatinine, Ser 1.610.90 0.44 - 1.00 mg/dL   Glucose, Bld 96 65 - 99 mg/dL   Calcium, Ion 0.961.07 (L) 1.13 - 1.30  mmol/L   TCO2 28 0 - 100 mmol/L   Hemoglobin 9.5 (L) 12.0 - 15.0 g/dL   HCT 40.9 (L) 81.1 - 91.4 %  I-stat troponin, ED     Status: Abnormal   Collection Time: 03/04/16  2:00 AM  Result Value Ref Range   Troponin i, poc 0.17 (HH) 0.00 - 0.08 ng/mL   Comment NOTIFIED PHYSICIAN    Comment 3          CK     Status: Abnormal   Collection Time: 03/04/16  2:46 AM  Result Value Ref Range   Total CK 696 (H) 38 - 234 U/L  Urinalysis, Routine w reflex microscopic (not at [facility])     Status: Abnormal   Collection Time: 03/04/16  3:44 AM  Result Value  Ref Range   Color, Urine AMBER (A) YELLOW   APPearance CLEAR CLEAR   Specific Gravity, Urine 1.019 1.005 - 1.030   pH 6.5 5.0 - 8.0   Glucose, UA NEGATIVE NEGATIVE mg/dL   Hgb urine dipstick MODERATE (A) NEGATIVE   Bilirubin Urine SMALL (A) NEGATIVE   Ketones, ur 40 (A) NEGATIVE mg/dL   Protein, ur NEGATIVE NEGATIVE mg/dL   Nitrite NEGATIVE NEGATIVE   Leukocytes, UA SMALL (A) NEGATIVE  Urine microscopic-add on     Status: Abnormal   Collection Time: 03/04/16  3:44 AM  Result Value Ref Range   Squamous Epithelial / LPF 0-5 (A) NONE SEEN   WBC, UA 0-5 0 - 5 WBC/hpf   RBC / HPF 6-30 0 - 5 RBC/hpf   Bacteria, UA RARE (A) NONE SEEN  Comprehensive metabolic panel     Status: Abnormal   Collection Time: 03/04/16  6:25 AM  Result Value Ref Range   Sodium 133 (L) 135 - 145 mmol/L   Potassium 3.1 (L) 3.5 - 5.1 mmol/L   Chloride 97 (L) 101 - 111 mmol/L   CO2 28 22 - 32 mmol/L   Glucose, Bld 118 (H) 65 - 99 mg/dL   BUN 24 (H) 6 - 20 mg/dL   Creatinine, Ser 7.82 0.44 - 1.00 mg/dL   Calcium 7.9 (L) 8.9 - 10.3 mg/dL   Total Protein 5.6 (L) 6.5 - 8.1 g/dL   Albumin 2.8 (L) 3.5 - 5.0 g/dL   AST 55 (H) 15 - 41 U/L   ALT 29 14 - 54 U/L   Alkaline Phosphatase 45 38 - 126 U/L   Total Bilirubin 2.3 (H) 0.3 - 1.2 mg/dL   GFR calc non Af Amer >60 >60 mL/min   GFR calc Af Amer >60 >60 mL/min   Anion gap 8 5 - 15  CBC     Status: Abnormal   Collection Time: 03/04/16  6:25 AM  Result Value Ref Range   WBC 12.1 (H) 4.0 - 10.5 K/uL   RBC 3.20 (L) 3.87 - 5.11 MIL/uL   Hemoglobin 9.7 (L) 12.0 - 15.0 g/dL   HCT 95.6 (L) 21.3 - 08.6 %   MCV 89.7 78.0 - 100.0 fL   MCH 30.3 26.0 - 34.0 pg   MCHC 33.8 30.0 - 36.0 g/dL   RDW 57.8 46.9 - 62.9 %   Platelets 298 150 - 400 K/uL  Type and screen [facility]     Status: None   Collection Time: 03/04/16  6:25 AM  Result Value Ref Range   ABO/RH(D) O POS    Antibody Screen NEG    Sample Expiration 03/07/2016   Troponin I (q 6hr  x 3)      Status: Abnormal   Collection Time: 03/04/16  6:25 AM  Result Value Ref Range   Troponin I 0.22 (H) <0.031 ng/mL  Troponin I (q 6hr x 3)     Status: Abnormal   Collection Time: 03/04/16  6:25 AM  Result Value Ref Range   Troponin I 0.23 (H) <0.031 ng/mL  Procalcitonin - Baseline     Status: None   Collection Time: 03/04/16  6:25 AM  Result Value Ref Range   Procalcitonin 0.27 ng/mL  Iron and TIBC     Status: Abnormal   Collection Time: 03/04/16  6:25 AM  Result Value Ref Range   Iron 34 28 - 170 ug/dL   TIBC 604 (L) 540 - 981 ug/dL   Saturation Ratios 15 10.4 - 31.8 %   UIBC 196 ug/dL  TSH     Status: Abnormal   Collection Time: 03/04/16  6:25 AM  Result Value Ref Range   TSH 4.759 (H) 0.350 - 4.500 uIU/mL  Vitamin B12     Status: None   Collection Time: 03/04/16  6:25 AM  Result Value Ref Range   Vitamin B-12 765 180 - 914 pg/mL  ABO/Rh     Status: None   Collection Time: 03/04/16  6:25 AM  Result Value Ref Range   ABO/RH(D) O POS   Magnesium     Status: None   Collection Time: 03/04/16  6:25 AM  Result Value Ref Range   Magnesium 1.9 1.7 - 2.4 mg/dL  Troponin I (q 6hr x 3)     Status: Abnormal   Collection Time: 03/04/16 11:29 AM  Result Value Ref Range   Troponin I 0.19 (H) <0.031 ng/mL   No results found for this or any previous visit (from the past 240 hour(s)).  Renal Function:  Recent Labs  03/04/16 0039 03/04/16 0625  CREATININE 0.90 0.68   Estimated Creatinine Clearance: 35.3 mL/min (by C-G formula based on Cr of 0.68).  Radiologic Imaging: Dg Chest 1 View  03/04/2016  CLINICAL DATA:  Oxygen deficiency today. EXAM: CHEST 1 VIEW COMPARISON:  Chest x-ray dated 03/03/2016. FINDINGS: Comparison to yesterday's x-ray is limited by patient rotation. Cardiomediastinal silhouette appears grossly stable in size and configuration. Dense opacity again noted at the left lung base. Ill-defined opacity at the right lung base is likely a combination of pleural effusion  and consolidation. Overall, no significant change. Atherosclerotic changes again noted at the aortic arch. IMPRESSION: 1. No significant change compared to yesterday's chest x-ray. Bibasilar opacities, left greater than right, could represent pneumonia or atelectasis/airspace collapse. Right pleural effusion appears stable, small to moderate in size. 2. Aortic atherosclerosis. Electronically Signed   By: Bary Richard M.D.   On: 03/04/2016 14:33   Dg Chest 2 View  03/04/2016  CLINICAL DATA:  Status post fall, with pain at the left humerus. Initial encounter. EXAM: CHEST  2 VIEW COMPARISON:  None. FINDINGS: A moderate right-sided pleural effusion is noted, with patchy bilateral airspace opacity. This may reflect pneumonia or pulmonary edema. No pneumothorax is seen. The cardiomediastinal silhouette remains normal in size. No acute osseous abnormalities are identified. There is diffuse osteopenia of visualized osseous structures. Clips are noted within the right upper quadrant, reflecting prior cholecystectomy. IMPRESSION: Moderate right-sided pleural effusion, with patchy bilateral airspace opacity. This may reflect pneumonia or pulmonary edema. Electronically Signed   By: Roanna Raider M.D.   On: 03/04/2016 00:35   Ct Head Wo Contrast  03/04/2016  CLINICAL DATA:  82 year old female with fall EXAM: CT HEAD WITHOUT CONTRAST CT CERVICAL SPINE WITHOUT CONTRAST TECHNIQUE: Multidetector CT imaging of the head and cervical spine was performed following the standard protocol without intravenous contrast. Multiplanar CT image reconstructions of the cervical spine were also generated. COMPARISON:  None. FINDINGS: CT HEAD FINDINGS The ventricles and sulci are mildly prominent compatible with mild age-related atrophy. Mild moderate periventricular and deep white matter chronic microvascular ischemic changes noted. There is no acute intracranial hemorrhage. No mass effect or midline shift noted. The visualized paranasal  sinuses and mastoid air cells are clear. The calvarium is intact. CT CERVICAL SPINE FINDINGS There is no acute fracture or subluxation of the cervical spine.There is osteopenia with multilevel degenerative changes of the spine.The odontoid and spinous processes are intact.There is normal anatomic alignment of the C1-C2 lateral masses. The visualized soft tissues appear unremarkable. Partially visualized right and probable left pleural effusions. There is biapical scarring with increased interstitial septal prominence likely a degree of edema. IMPRESSION: No acute intracranial hemorrhage. No acute/ traumatic cervical spine pathology. Electronically Signed   By: Elgie CollardArash  Radparvar M.D.   On: 03/04/2016 00:50   Ct Cervical Spine Wo Contrast  03/04/2016  CLINICAL DATA:  82 year old female with fall EXAM: CT HEAD WITHOUT CONTRAST CT CERVICAL SPINE WITHOUT CONTRAST TECHNIQUE: Multidetector CT imaging of the head and cervical spine was performed following the standard protocol without intravenous contrast. Multiplanar CT image reconstructions of the cervical spine were also generated. COMPARISON:  None. FINDINGS: CT HEAD FINDINGS The ventricles and sulci are mildly prominent compatible with mild age-related atrophy. Mild moderate periventricular and deep white matter chronic microvascular ischemic changes noted. There is no acute intracranial hemorrhage. No mass effect or midline shift noted. The visualized paranasal sinuses and mastoid air cells are clear. The calvarium is intact. CT CERVICAL SPINE FINDINGS There is no acute fracture or subluxation of the cervical spine.There is osteopenia with multilevel degenerative changes of the spine.The odontoid and spinous processes are intact.There is normal anatomic alignment of the C1-C2 lateral masses. The visualized soft tissues appear unremarkable. Partially visualized right and probable left pleural effusions. There is biapical scarring with increased interstitial septal  prominence likely a degree of edema. IMPRESSION: No acute intracranial hemorrhage. No acute/ traumatic cervical spine pathology. Electronically Signed   By: Elgie CollardArash  Radparvar M.D.   On: 03/04/2016 00:50   Dg Humerus Left  03/04/2016  CLINICAL DATA:  82 year old female with fall and left upper extremity pain. EXAM: LEFT HUMERUS - 2+ VIEW COMPARISON:  None. FINDINGS: No definite acute fracture or dislocation identified. There is advanced osteopenia which limits evaluation for fracture. There is degenerative changes of the shoulder. The soft tissues appear unremarkable. No radiopaque foreign object identified. IMPRESSION: No acute fracture or dislocation. Electronically Signed   By: Elgie CollardArash  Radparvar M.D.   On: 03/04/2016 00:36   Dg Hip Unilat With Pelvis 2-3 Views Left  03/04/2016  CLINICAL DATA:  Status post fall, with left hip pain. Initial encounter. EXAM: DG HIP (WITH OR WITHOUT PELVIS) 2-3V LEFT COMPARISON:  None. FINDINGS: There is no evidence of fracture or dislocation. Both femoral heads are seated normally within their respective acetabula. The proximal left femur appears intact. No significant degenerative change is appreciated. The sacroiliac joints are unremarkable in appearance. The visualized bowel gas pattern is grossly unremarkable in appearance. IMPRESSION: No evidence of fracture or dislocation. Electronically Signed   By: Roanna RaiderJeffery  Chang M.D.   On: 03/04/2016 00:36   Procedure description:  After sterile prep and drape, I attempted to pass a 14 French coud-tip catheter. There is severe vaginal stenosis, and I could not even admit the tip of my fifth finger. Despite multiple attempts with the coud-tip catheter, I was unable to find the urethral meatus and catheterize the bladder. Following this, with the same sterile prep and drape, a used a 16 [place] flexible Wolf cystoscope. The scope easily went into the vagina. I thought I saw at first the urethral meatus, and I tried using a 0.038 inch  sensor-tip guidewire to negotiate this. Unfortunately, this was unsuccessful and, after multiple attempts, I felt like that was not the urethral meatus. I then moved the scope more distally in her vagina. I finally found what I thought was the urethral meatus. I then was able to pass a guidewire through this area, and I followed the guidewire, with the scope, into the bladder which appeared to be normal. The cystoscope was then removed. I then, over the 0.038 inch guidewire, passed an 18 [place] council tip catheter. This easily passed into the bladder through the urethral meatus. A large amount of clear amber urine was obtained. The balloon was filled with 10 mL of water. I then hooked the catheter to dependent drainage.  The patient tolerated procedure well.   I independently reviewed the above imaging studies.  Impression/Assessment:  1. Severe vaginal stenosis necessitating cystoscopic assisted catheter placement  2. Incomplete bladder emptying  Plan:  I would leave the catheter in until she is more ambulatory. She has been doing fine long-term with her current situations, so once she is ready to be discharged, take the catheter out the morning before for voiding trial  The antibiotics that she is currently on will cover her instrumentation  Reconsult [place] when necessary

## 2016-03-04 NOTE — Clinical Social Work Note (Addendum)
CSW reviewed patient chart due to consult regarding assessment of patient needs. On 6/23 CSW Harlon FlorWhitaker talked with patient and daughter, assessed patient's needs and indicated that a report would be made to APS. Physical therapy evaluated patient today and is recommending SNF, however as patient is Medicare A&B and Observation status, her insurance will not pay for a skilled nursing facility stay for rehab. CSW signing off, however please reconsult if any other CSW services needed.  Genelle BalVanessa Zamarion Longest, MSW, LCSW Licensed Clinical Social Worker Clinical Social Work Department [facility] [phone]

## 2016-03-04 NOTE — Evaluation (Signed)
Physical Therapy Evaluation Patient Details Name: Reubin Milanellie Enoch MRN: [id number] DOB: 04/28/1924 Today's Date: 03/04/2016   History of Present Illness  82 yo female admitted with rhabdomyolysis after sustaining a fall at home. Pt was down for 3 days. Hx of chronic ankle wound.   Clinical Impression  On eval, pt required Min assist for mobility. Pt was able to perform a stand pivot with RW, bed to recliner. Dyspnea 2/4 with minimal activity-O2 remained on pt. Pt is fearful of falling. She is weak and remains at risk for falls. At this time, recommend ST rehab at SNF if pt is agreeable. If pt returns home, recommend HHPT and 24 hour supervision/assist.     Follow Up Recommendations SNF    Equipment Recommendations  Rolling walker with 5" wheels (youth height)    Recommendations for Other Services       Precautions / Restrictions Precautions Precautions: Fall Restrictions Weight Bearing Restrictions: No      Mobility  Bed Mobility Overal bed mobility: Needs Assistance Bed Mobility: Supine to Sit     Supine to sit: Min assist;HOB elevated     General bed mobility comments: assist to get to EOB. Increased time.   Transfers Overall transfer level: Needs assistance Equipment used: Rolling walker (2 wheeled) Transfers: Sit to/from [employer] CorporationStand;Stand Pivot Transfers Sit to Stand: Min assist Stand pivot transfers: Min assist       General transfer comment: Assist to rise, stabilize, control descent. VCs safety, technqiue, hand placement. Stand pivot, bed to recliner, with RW.   Ambulation/Gait             General Gait Details: NT-MD recommneded light activity on today.   Stairs            Wheelchair Mobility    Modified Rankin (Stroke Patients Only)       Balance Overall balance assessment: Needs assistance;History of Falls         Standing balance support: During functional activity Standing balance-Leahy Scale: Poor                                Pertinent Vitals/Pain Pain Assessment: Faces Faces Pain Scale: Hurts even more Pain Location: bil hips with activity Pain Descriptors / Indicators: Sore Pain Intervention(s): Limited activity within patient's tolerance    Home Living Family/patient expects to be discharged to:: Private residence Living Arrangements: Alone   Type of Home: House Home Access: Stairs to enter   Secretary/administratorntrance Stairs-Number of Steps: 2 Home Layout: One level Home Equipment: Cane - single point      Prior Function Level of Independence: Independent with assistive device(s)         Comments: recently began using cane.      Hand Dominance        Extremity/Trunk Assessment   Upper Extremity Assessment: Generalized weakness           Lower Extremity Assessment: Generalized weakness      Cervical / Trunk Assessment: Kyphotic  Communication   Communication: HOH  Cognition Arousal/Alertness: Awake/alert Behavior During Therapy: WFL for tasks assessed/performed Overall Cognitive Status: Within Functional Limits for tasks assessed                      General Comments      Exercises        Assessment/Plan    PT Assessment Patient needs continued PT services  PT Diagnosis Difficulty walking;Generalized weakness;Acute pain  PT Problem List Decreased strength;Decreased activity tolerance;Decreased balance;Decreased mobility;Decreased knowledge of use of DME;Pain  PT Treatment Interventions DME instruction;Gait training;Functional mobility training;Therapeutic activities;Patient/family education;Balance training;Therapeutic exercise   PT Goals (Current goals can be found in the Care Plan section) Acute Rehab PT Goals Patient Stated Goal: to regain independence PT Goal Formulation: With patient Time For Goal Achievement: 03/18/16 Potential to Achieve Goals: Good    Frequency Min 3X/week   Barriers to discharge        Co-evaluation               End of  Session Equipment Utilized During Treatment: Gait belt Activity Tolerance: Patient limited by fatigue;Patient limited by pain Patient left: in chair;with call bell/phone within reach (made RN aware that room was missing a chair alarm box. Pad placed under pt)      Functional Assessment Tool Used: clinical judgement Functional Limitation: Mobility: Walking and moving around Mobility: Walking and Moving Around Current Status [phone](G8978): At least 1 percent but less than 20 percent impaired, limited or restricted    Time: 0950-1006 PT Time Calculation (min) (ACUTE ONLY): 16 min   Charges:   PT Evaluation $PT Eval Low Complexity: 1 Procedure     PT G Codes:   PT G-Codes **NOT FOR INPATIENT CLASS** Functional Assessment Tool Used: clinical judgement Functional Limitation: Mobility: Walking and moving around Mobility: Walking and Moving Around Current Status ([id number](G8978): At least 1 percent but less than 20 percent impaired, limited or restricted    Rebeca AlertJannie Lunabelle Oatley, MPT Pager: [phone]

## 2016-03-05 LAB — FERRITIN: Ferritin: 140 ng/mL (ref 11–307)

## 2016-03-05 LAB — BASIC METABOLIC PANEL
Anion gap: 5 (ref 5–15)
BUN: 25 mg/dL — AB (ref 6–20)
CHLORIDE: 100 mmol/L — AB (ref 101–111)
CO2: 29 mmol/L (ref 22–32)
CREATININE: 0.71 mg/dL (ref 0.44–1.00)
Calcium: 7.9 mg/dL — ABNORMAL LOW (ref 8.9–10.3)
GFR calc Af Amer: >60 mL/min (ref >60)
GLUCOSE: 108 mg/dL — AB (ref 65–99)
Potassium: 4.2 mmol/L (ref 3.5–5.1)
SODIUM: 134 mmol/L — AB (ref 135–145)

## 2016-03-05 LAB — CK TOTAL AND CKMB (NOT AT ARMC)
CK, MB: 8.3 ng/mL — ABNORMAL HIGH (ref 0.5–5.0)
RELATIVE INDEX: 3.4 — AB (ref 0.0–2.5)
Total CK: 247 U/L — ABNORMAL HIGH (ref 38–234)

## 2016-03-05 LAB — CBC
HCT: 25.4 % — ABNORMAL LOW (ref 36.0–46.0)
Hemoglobin: 8.5 g/dL — ABNORMAL LOW (ref 12.0–15.0)
MCH: 31.3 pg (ref 26.0–34.0)
MCHC: 33.5 g/dL (ref 30.0–36.0)
MCV: 93.4 fL (ref 78.0–100.0)
PLATELETS: 290 10*3/uL (ref 150–400)
RBC: 2.72 MIL/uL — ABNORMAL LOW (ref 3.87–5.11)
RDW: 16.2 % — ABNORMAL HIGH (ref 11.5–15.5)
WBC: 10.5 10*3/uL (ref 4.0–10.5)

## 2016-03-05 LAB — BRAIN NATRIURETIC PEPTIDE: B Natriuretic Peptide: 895.8 pg/mL — ABNORMAL HIGH (ref 0.0–100.0)

## 2016-03-05 LAB — T4, FREE: FREE T4: 1.1 ng/dL (ref 0.61–1.12)

## 2016-03-05 MED ORDER — FUROSEMIDE 40 MG PO TABS
40.0000 mg | ORAL_TABLET | Freq: Once | ORAL | Status: AC
  Administered 2016-03-05: 40 mg via ORAL
  Filled 2016-03-05: qty 1

## 2016-03-05 NOTE — Consult Note (Signed)
WOC wound consult note Reason for Consult: multiple pressure injuries and incontinence associated dermatitis from fall and time spent "down" Wound type:Pressure, moisture Pressure Ulcer POA: Yes Measurement:  Mid-thoracic region:  Inverted "V" formation with 9 partial thickness open areas, Stage 2.  The largest wound measures 0.6cm x 0.4cm and is partially covered with a light yellow movable slough.  The remaining wound bed is light pink and moist.   The right shoulder Stage 3 Pressure Injury measures 2.8cm x 2.5cm x 0.2cm and is partially obscured by the presence of light yellow slough.  The remaining wound bed is light pink and moist.   The right medial foot Stage 2 pressure injury measures 0.5cm round x 0.2cm. The wound bed is clean pink and moist. The coccyx pressure injury Stage 2 is located in the center of an area affected by incontinence (IAD) and measures 0.4cm round x 0.2cm. Wound bed is clean pink and moist. Wound bed: The right foot and coccyx wounds are clean, pink and moist.  The right shoulder and mid-thoracic wounds are covered with a thin layer of light yellow slough that is movable and not firmly attached. Drainage (amount, consistency, odor) The only draining ulcer is the Stage 3 on the right shoulder which is exuding light yellow exudate consistent with the autolytic debridement of the thin yellow layer of slough covering the wound bed. There is no odor to any of the wounds. Periwound: intact, large areas of ecchymosis.  No periwound erythema or induration, warmth or fluctuance Dressing procedure/placement/frequency: I have today provided bilateral pressure redistribution heel boots, a mattress replacement with low air loss feature and a pressure redistribution chair pad for her use when OOB to the chair.  For topical care, a prophylactic foam dressing is place to the sacrum, the same will be used to provide a moisture retentive and healing environment for the inverted "V" shaped  partial thickness (Stage 2) wounds on her mid thoracic region, the partial thickness (Stage 2) area on her right foot medial aspect, and the Stage 3 pressure injury on the right shoulder. The Stage 2 wound in the center of an area impacted by incontinence associate dermatitis will be treated with application of a moisture barrier as the patient has been incontinenent of stool and other topicals will not be as effective. WOC nursing team will not follow, but will remain available to this patient, the nursing and medical teams.  Please re-consult if needed. Thanks, Ladona MowLaurie Savannaha Stonerock, MSN, RN, GNP, Hans EdenCWOCN, CWON-AP, FAAN  Pager# [phone]

## 2016-03-05 NOTE — Clinical Social Work Placement (Signed)
   CLINICAL SOCIAL WORK PLACEMENT  NOTE  Date:  03/05/2016  Patient Details  Name: Laura Sanchez MRN: [id number] Date of Birth: 01/06/1924  Clinical Social Work is seeking post-discharge placement for this patient at the Skilled  Nursing Facility level of care (*CSW will initial, date and re-position this form in  chart as items are completed):  Yes   Patient/family provided with [place] Clinical Social Work Department's list of facilities offering this level of care within the geographic area requested by the patient (or if unable, by the patient's family).  Yes   Patient/family informed of their freedom to choose among providers that offer the needed level of care, that participate in Medicare, Medicaid or managed care program needed by the patient, have an available bed and are willing to accept the patient.      Patient/family informed of [facility]'s ownership interest in [facility] and [facility], as well as of the fact that they are under no obligation to receive care at these facilities.  PASRR submitted to EDS on 03/05/16     PASRR number received on 03/05/16     Existing PASRR number confirmed on 03/05/16     FL2 transmitted to all facilities in geographic area requested by pt/family on       FL2 transmitted to all facilities within larger geographic area on       Patient informed that his/her managed care company has contracts with or will negotiate with certain facilities, including the following:            Patient/family informed of bed offers received.  Patient chooses bed at       Physician recommends and patient chooses bed at      Patient to be transferred to   on  .  Patient to be transferred to facility by       Patient family notified on   of transfer.  Name of family member notified:        PHYSICIAN Please sign FL2, Please prepare prescriptions     Additional Comment:    _______________________________________________ Raye Sorrowoble,  Malashia Kamaka N, LCSW 03/05/2016, 2:03 PM

## 2016-03-05 NOTE — Progress Notes (Signed)
PROGRESS NOTE                                                                                                                                                                                                             Patient Demographics:    Laura Sanchez, is a 82 y.o. female, DOB - Sep 01, 1924, ZOX:[id number]  Admit date - 03/03/2016   Admitting Physician Clydie Braun, MD  Outpatient Primary MD for the patient is No primary care provider on file.  LOS - 1  Chief Complaint  Patient presents with  . Fall       Brief Narrative    Laura Sanchez is a 82 y.o. female with no reported significant past medical history; who presents after being found down in her home, was lying down on the floor for 3 days. She also report frequent fall for last couple of weeks. She was found out to have mild elevated troponin, CK 696, Moderate right-sided pleural effusion, with patchy bilateral airspace opacity. This may reflect pneumonia or pulmonary edema.    Subjective:    Cherina Dhillon today has, No headache, No chest pain, No abdominal pain - No Nausea, No new weakness tingling or numbness, No Cough - SOB.     Assessment  & Plan :     1. Mechanical fall with mild rhabdomyolysis. Stable now, CK levels have plateaued, no renal insufficiency good urine output, has been adequately hydrated, 89 and will require SNF.  2. Respiratory failure. Due to combination of pneumonia and acute on chronic diastolic CHF. Continue empiric antibiotics, supportive care with oxygen and nebulizer treatments as needed, EF preserved at 60%. Gentle diuresis.  3. Incidental finding of Critical AS and Mod Pulm HTN on TTE - to be asymptomatic, outpatient follow-up with cardiology questioned TAVR procedure.  4. Mild non-ACS pattern flat troponin trend. No chest pain,  5. Anemia of chronic disease worse with dilution due to IV fluids. Stable for now we'll  monitor. No signs of bleeding.  6. Prolonged QTC due to hypokalemia. Resolved.  7. Borderline elevated TSH. Could be sick euthyroid, repeat TSH along with free T3 and T4 in 4-6 weeks.   Code Status :  No intubation or CPR  Family Communication  :  None present  Disposition Plan  :  SNF  Consults  :  Urology- Foley  placement  Procedures  :    TTE  Left ventricle: The cavity size was normal. There was moderate concentric hypertrophy. Systolic function was vigorous. The estimated ejection fraction was in the range of 65% to 70%. Wall motion was normal; there were no regional wall motion abnormalities. Doppler parameters are consistent with abnormal  left ventricular relaxation (grade 1 diastolic dysfunction).  Doppler parameters are consistent with elevated ventricular end-diastolic filling pressure. - Aortic valve: Trileaflet; severely thickened, severely calcified leaflets. Valve mobility was restricted. There was critical stenosis. Peak gradient (S): 133 mm Hg. Valve area (VTI): 0.47 cm^2. Valve area (Vmax): 0.53 cm^2. Valve area (Vmean): 0.51 cm^2. - Mitral valve: Calcified annulus. Mildly thickened leaflets . There was moderate regurgitation. - Left atrium: The atrium was mildly dilated. - Tricuspid valve: There was moderate regurgitation. - Pulmonary arteries: Systolic pressure was severely increased. PA peak pressure: 57 mm Hg (S). - Inferior vena cava: The vessel was normal in size. - Pericardium, extracardiac: There was no pericardial effusion.  Impressions:  - Critical aortic stenosis.  Normal LVEF.  Severe pulmonary hypertension.  Foley placement  CT head and C-spine nonacute.  DVT Prophylaxis  :  Lovenox    Lab Results  Component Value Date   PLT 290 03/05/2016    Inpatient Medications  Scheduled Meds: . sodium chloride   Intravenous Once  . azithromycin  500 mg Oral Daily  . cefTRIAXone (ROCEPHIN)  IV  1 g Intravenous Q24H  . enoxaparin (LOVENOX)  injection  40 mg Subcutaneous Q24H  . furosemide  40 mg Oral Once  . guaiFENesin  600 mg Oral BID  . multivitamin  15 mL Oral Daily  . sodium chloride flush  3 mL Intravenous Q12H   Continuous Infusions:  PRN Meds:.albuterol, alum & mag hydroxide-simeth, prochlorperazine  Antibiotics  :    Anti-infectives    Start     Dose/Rate Route Frequency Ordered Stop   03/04/16 2200  cefTRIAXone (ROCEPHIN) 1 g in dextrose 5 % 50 mL IVPB     1 g 100 mL/hr over 30 Minutes Intravenous Every 24 hours 03/04/16 1152     03/04/16 2200  azithromycin (ZITHROMAX) tablet 500 mg     500 mg Oral Daily 03/04/16 1152     03/04/16 0130  cefTRIAXone (ROCEPHIN) 1 g in dextrose 5 % 50 mL IVPB     1 g 100 mL/hr over 30 Minutes Intravenous  Once 03/04/16 0123 03/04/16 0333   03/04/16 0130  azithromycin (ZITHROMAX) 500 mg in dextrose 5 % 250 mL IVPB     500 mg 250 mL/hr over 60 Minutes Intravenous  Once 03/04/16 0123 03/04/16 0433         Objective:   Filed Vitals:   03/04/16 0600 03/04/16 1305 03/04/16 2125 03/05/16 0556  BP: 164/71 135/48 107/54 106/40  Pulse: 59 70 71 48  Temp: 97.7 F (36.5 C) 97.8 F (36.6 C) 97.7 F (36.5 C) 98.6 F (37 C)  TempSrc: Oral  Oral Oral  Resp: Height:      Weight:      SpO2: 96% 97% 100% 100%    Wt Readings from Last 3 Encounters:  03/04/16 47.9 kg (105 lb 9.6 oz)     Intake/Output Summary (Last 24 hours) at 03/05/16 0921 Last data filed at 03/05/16 1610  Gross per 24 hour  Intake    175 ml  Output   1225 ml  Net  -1050 ml     Physical  Exam  Awake Alert, No new F.N deficits, Normal affect [place].AT,PERRAL Supple Neck,No JVD, No cervical lymphadenopathy appriciated.  Symmetrical Chest wall movement, Good air movement bilaterally, CTAB RRR,No Gallops,Rubs , +ve aortic systolic Murmur, No Parasternal Heave +ve B.Sounds, Abd Soft, No tenderness, No organomegaly appriciated, No rebound - guarding or rigidity. Foley in place No Cyanosis, Clubbing  or edema, No new Rash or bruise     Data Review:    CBC  Recent Labs Lab 03/03/16 0030 03/04/16 0039 03/04/16 0625 03/05/16 0510  WBC 13.9*  --  12.1* 10.5  HGB 9.4* 9.5* 9.7* 8.5*  HCT 27.6* 28.0* 28.7* 25.4*  PLT 292  --  298 290  MCV 92.0  --  89.7 93.4  MCH 31.3  --  30.3 31.3  MCHC 34.1  --  33.8 33.5  RDW 15.6*  --  15.5 16.2*  LYMPHSABS 0.7  --   --   --   MONOABS 1.5*  --   --   --   EOSABS 0.0  --   --   --   BASOSABS 0.0  --   --   --     Chemistries   Recent Labs Lab 03/04/16 0039 03/04/16 0625 03/05/16 0510  NA 133* 133* 134*  K 3.5 3.1* 4.2  CL 91* 97* 100*  CO2  --  28 29  GLUCOSE 96 118* 108*  BUN 25* 24* 25*  CREATININE 0.90 0.68 0.71  CALCIUM  --  7.9* 7.9*  MG  --  1.9  --   AST  --  55*  --   ALT  --  29  --   ALKPHOS  --  45  --   BILITOT  --  2.3*  --    ------------------------------------------------------------------------------------------------------------------ No results for input(s): CHOL, HDL, LDLCALC, TRIG, CHOLHDL, LDLDIRECT in the last 72 hours.  No results found for: HGBA1C ------------------------------------------------------------------------------------------------------------------  Recent Labs  03/04/16 0625  TSH 4.759*   ------------------------------------------------------------------------------------------------------------------  Recent Labs  03/04/16 0625  VITAMINB12 765  TIBC 230*  IRON 34    Coagulation profile No results for input(s): INR, PROTIME in the last 168 hours.  No results for input(s): DDIMER in the last 72 hours.  Cardiac Enzymes  Recent Labs Lab 03/03/16 0030 03/04/16 0625 03/04/16 1129  CKMB 13.9*  --   --   TROPONINI  --  0.22*  0.23* 0.19*   ------------------------------------------------------------------------------------------------------------------    Component Value Date/Time   BNP 895.8* 03/05/2016 0510    Micro Results No results found for this or any  previous visit (from the past 240 hour(s)).  Radiology Reports Dg Chest 1 View  03/04/2016  CLINICAL DATA:  Oxygen deficiency today. EXAM: CHEST 1 VIEW COMPARISON:  Chest x-ray dated 03/03/2016. FINDINGS: Comparison to yesterday's x-ray is limited by patient rotation. Cardiomediastinal silhouette appears grossly stable in size and configuration. Dense opacity again noted at the left lung base. Ill-defined opacity at the right lung base is likely a combination of pleural effusion and consolidation. Overall, no significant change. Atherosclerotic changes again noted at the aortic arch. IMPRESSION: 1. No significant change compared to yesterday's chest x-ray. Bibasilar opacities, left greater than right, could represent pneumonia or atelectasis/airspace collapse. Right pleural effusion appears stable, small to moderate in size. 2. Aortic atherosclerosis. Electronically Signed   By: Bary RichardStan  Maynard M.D.   On: 03/04/2016 14:33   Dg Chest 2 View  03/04/2016  CLINICAL DATA:  Status post fall, with pain at the left humerus. Initial encounter.  EXAM: CHEST  2 VIEW COMPARISON:  None. FINDINGS: A moderate right-sided pleural effusion is noted, with patchy bilateral airspace opacity. This may reflect pneumonia or pulmonary edema. No pneumothorax is seen. The cardiomediastinal silhouette remains normal in size. No acute osseous abnormalities are identified. There is diffuse osteopenia of visualized osseous structures. Clips are noted within the right upper quadrant, reflecting prior cholecystectomy. IMPRESSION: Moderate right-sided pleural effusion, with patchy bilateral airspace opacity. This may reflect pneumonia or pulmonary edema. Electronically Signed   By: Roanna Raider M.D.   On: 03/04/2016 00:35   Ct Head Wo Contrast  03/04/2016  CLINICAL DATA:  82 year old female with fall EXAM: CT HEAD WITHOUT CONTRAST CT CERVICAL SPINE WITHOUT CONTRAST TECHNIQUE: Multidetector CT imaging of the head and cervical spine was  performed following the standard protocol without intravenous contrast. Multiplanar CT image reconstructions of the cervical spine were also generated. COMPARISON:  None. FINDINGS: CT HEAD FINDINGS The ventricles and sulci are mildly prominent compatible with mild age-related atrophy. Mild moderate periventricular and deep white matter chronic microvascular ischemic changes noted. There is no acute intracranial hemorrhage. No mass effect or midline shift noted. The visualized paranasal sinuses and mastoid air cells are clear. The calvarium is intact. CT CERVICAL SPINE FINDINGS There is no acute fracture or subluxation of the cervical spine.There is osteopenia with multilevel degenerative changes of the spine.The odontoid and spinous processes are intact.There is normal anatomic alignment of the C1-C2 lateral masses. The visualized soft tissues appear unremarkable. Partially visualized right and probable left pleural effusions. There is biapical scarring with increased interstitial septal prominence likely a degree of edema. IMPRESSION: No acute intracranial hemorrhage. No acute/ traumatic cervical spine pathology. Electronically Signed   By: Elgie Collard M.D.   On: 03/04/2016 00:50   Ct Cervical Spine Wo Contrast  03/04/2016  CLINICAL DATA:  82 year old female with fall EXAM: CT HEAD WITHOUT CONTRAST CT CERVICAL SPINE WITHOUT CONTRAST TECHNIQUE: Multidetector CT imaging of the head and cervical spine was performed following the standard protocol without intravenous contrast. Multiplanar CT image reconstructions of the cervical spine were also generated. COMPARISON:  None. FINDINGS: CT HEAD FINDINGS The ventricles and sulci are mildly prominent compatible with mild age-related atrophy. Mild moderate periventricular and deep white matter chronic microvascular ischemic changes noted. There is no acute intracranial hemorrhage. No mass effect or midline shift noted. The visualized paranasal sinuses and mastoid air  cells are clear. The calvarium is intact. CT CERVICAL SPINE FINDINGS There is no acute fracture or subluxation of the cervical spine.There is osteopenia with multilevel degenerative changes of the spine.The odontoid and spinous processes are intact.There is normal anatomic alignment of the C1-C2 lateral masses. The visualized soft tissues appear unremarkable. Partially visualized right and probable left pleural effusions. There is biapical scarring with increased interstitial septal prominence likely a degree of edema. IMPRESSION: No acute intracranial hemorrhage. No acute/ traumatic cervical spine pathology. Electronically Signed   By: Elgie Collard M.D.   On: 03/04/2016 00:50   Dg Humerus Left  03/04/2016  CLINICAL DATA:  82 year old female with fall and left upper extremity pain. EXAM: LEFT HUMERUS - 2+ VIEW COMPARISON:  None. FINDINGS: No definite acute fracture or dislocation identified. There is advanced osteopenia which limits evaluation for fracture. There is degenerative changes of the shoulder. The soft tissues appear unremarkable. No radiopaque foreign object identified. IMPRESSION: No acute fracture or dislocation. Electronically Signed   By: Elgie Collard M.D.   On: 03/04/2016 00:36   Dg Hip Unilat With Pelvis  2-3 Views Left  03/04/2016  CLINICAL DATA:  Status post fall, with left hip pain. Initial encounter. EXAM: DG HIP (WITH OR WITHOUT PELVIS) 2-3V LEFT COMPARISON:  None. FINDINGS: There is no evidence of fracture or dislocation. Both femoral heads are seated normally within their respective acetabula. The proximal left femur appears intact. No significant degenerative change is appreciated. The sacroiliac joints are unremarkable in appearance. The visualized bowel gas pattern is grossly unremarkable in appearance. IMPRESSION: No evidence of fracture or dislocation. Electronically Signed   By: Roanna RaiderJeffery  Chang M.D.   On: 03/04/2016 00:36    Time Spent in minutes  30   Lavena Loretto K  M.D on 03/05/2016 at 9:21 AM  Between 7am to 7pm - Pager - [phone]  After 7pm go to [email or web address] - password [email or web address]  [facility] -  Office  [phone]

## 2016-03-05 NOTE — NC FL2 (Signed)
[place] MEDICAID FL2 LEVEL OF CARE SCREENING TOOL     IDENTIFICATION  Patient Name: Laura Sanchez Birthdate: 12/07/1923 Sex: female Admission Date (Current Location): 03/03/2016  [facility] [place]   Facility and Address:  [facility],  [address], [place] [id number]      Provider Number: [id number]  Attending Physician Name and Address:  Leroy SeaPrashant K Singh, MD  Relative Name and Phone Number:       Current Level of Care: Hospital Recommended Level of Care: Skilled Nursing Facility Prior Approval Number:    Date Approved/Denied:   PASRR Number:   [id number]   Discharge Plan: SNF    Current Diagnoses: Patient Active Problem List   Diagnosis Date Noted  . PNA (pneumonia) 03/04/2016  . Rhabdomyolysis 03/04/2016  . Fall 03/04/2016  . Prolonged Q-T interval on ECG 03/04/2016  . Anemia 03/04/2016  . Elevated troponin 03/04/2016  . Pressure ulcer 03/04/2016  . Community acquired pneumonia     Orientation RESPIRATION BLADDER Height & Weight     Self, Time, Situation, Place  O2 (3L) Incontinent Weight: 105 lb 9.6 oz (47.9 kg) Height:  5\' 4"  (162.6 cm)  BEHAVIORAL SYMPTOMS/MOOD NEUROLOGICAL BOWEL NUTRITION STATUS      Incontinent Diet (SOFT diet, pending SLP )  AMBULATORY STATUS COMMUNICATION OF NEEDS Skin   Limited Assist Verbally PU Stage and Appropriate Care (Stage II -  Partial thickness loss of dermis presenting as a shallow open ulcer with a red, pink wound bed without slough.)   PU Stage 2 Dressing:  (PRN)                   Personal Care Assistance Level of Assistance  Bathing, Feeding, Dressing Bathing Assistance: Limited assistance Feeding assistance: Independent Dressing Assistance: Limited assistance     Functional Limitations Info  Sight, Hearing, Speech Sight Info: Impaired Hearing Info: Impaired Speech Info: Adequate    SPECIAL CARE FACTORS FREQUENCY  PT (By licensed PT), OT (By licensed OT)     PT  Frequency: 5x OT Frequency: 5x            Contractures Contractures Info: Not present    Additional Factors Info  Code Status, Allergies Code Status Info: Partial Allergies Info: NKA           Current Medications (03/05/2016):  This is the current hospital active medication list Current Facility-Administered Medications  Medication Dose Route Frequency Provider Last Rate Last Dose  . 0.9 %  sodium chloride infusion   Intravenous Once Rondell A Katrinka BlazingSmith, MD      . albuterol (PROVENTIL) (2.5 MG/3ML) 0.083% nebulizer solution 2.5 mg  2.5 mg Nebulization Q6H PRN Belkys A Regalado, MD      . alum & mag hydroxide-simeth (MAALOX/MYLANTA) 200-200-20 MG/5ML suspension 15 mL  15 mL Oral PRN Belkys A Regalado, MD   15 mL at 03/04/16 2124  . azithromycin (ZITHROMAX) tablet 500 mg  500 mg Oral Daily Belkys A Regalado, MD   500 mg at 03/05/16 1131  . cefTRIAXone (ROCEPHIN) 1 g in dextrose 5 % 50 mL IVPB  1 g Intravenous Q24H Belkys A Regalado, MD   1 g at 03/04/16 2124  . enoxaparin (LOVENOX) injection 40 mg  40 mg Subcutaneous Q24H Belkys A Regalado, MD   40 mg at 03/05/16 1348  . guaiFENesin (MUCINEX) 12 hr tablet 600 mg  600 mg Oral BID Clydie Braunondell A Smith, MD   600 mg at 03/05/16 1135  . multivitamin  liquid 15 mL  15 mL Oral Daily Clydie Braunondell A Smith, MD   15 mL at 03/05/16 1131  . prochlorperazine (COMPAZINE) injection 5 mg  5 mg Intravenous Q6H PRN Belkys A Regalado, MD      . sodium chloride flush (NS) 0.9 % injection 3 mL  3 mL Intravenous Q12H Rondell Burtis JunesA Smith, MD   3 mL at 03/05/16 1000     Discharge Medications: Please see discharge summary for a list of discharge medications.  Relevant Imaging Results:  Relevant Lab Results:   Additional Information SS#  [id number]      Laura Sanchez, Laura Sanchez, [place]CS[place]

## 2016-03-05 NOTE — Progress Notes (Signed)
Physical Therapy Treatment Patient Details Name: Reubin Milanellie Ratz MRN: [id number] DOB: 10/09/1923 Today's Date: 03/05/2016    History of Present Illness 82 yo female admitted with rhabdomyolysis after sustaining a fall at home. Pt was down for 3 days. Hx of chronic ankle wound.     PT Comments    The patient was able to mobilize to the recliner today, oozing stool today. The patient will benefit from  SNF.  Follow Up Recommendations  SNF     Equipment Recommendations  Rolling walker with 5" wheels    Recommendations for Other Services       Precautions / Restrictions Precautions Precautions: Fall Precaution Comments: oozing stool    Mobility  Bed Mobility Overal bed mobility: Needs Assistance Bed Mobility: Supine to Sit     Supine to sit: Min assist;HOB elevated     General bed mobility comments: assist to get to EOB. Increased time.   Transfers Overall transfer level: Needs assistance Equipment used: Rolling walker (2 wheeled)   Sit to Stand: Min assist Stand pivot transfers: Min assist       General transfer comment: Assist to rise, stabilize, control descent. VCs safety, technqiue, hand placement.  stood x 4 minutes for pericare. Incontinent of BM.Stand pivot, bed to recliner, with RW.   Ambulation/Gait Ambulation/Gait assistance: Mod assist;+2 safety/equipment Ambulation Distance (Feet): 5 Feet Assistive device: Rolling walker (2 wheeled)       General Gait Details: Step to with RW. very  shakey,   Stairs            Wheelchair Mobility    Modified Rankin (Stroke Patients Only)       Balance   Sitting-balance support: Bilateral upper extremity supported;Feet supported Sitting balance-Leahy Scale: Fair     Standing balance support: Bilateral upper extremity supported;During functional activity Standing balance-Leahy Scale: Poor                      Cognition Arousal/Alertness: Awake/alert                           Exercises      General Comments        Pertinent Vitals/Pain Faces Pain Scale: Hurts even more Pain Location: bil hips, buttock and spine, has severe kyphosis Pain Descriptors / Indicators: Tender;Sore Pain Intervention(s): Limited activity within patient's tolerance;Monitored during session;Repositioned    Home Living                      Prior Function            PT Goals (current goals can now be found in the care plan section) Progress towards PT goals: Progressing toward goals    Frequency  Min 3X/week    PT Plan Current plan remains appropriate    Co-evaluation             End of Session Equipment Utilized During Treatment: Gait belt Activity Tolerance: Patient limited by fatigue;Patient limited by pain Patient left: in chair;with call bell/phone within reach     Time: 1459-1519 PT Time Calculation (min) (ACUTE ONLY): 20 min  Charges:  $Therapeutic Activity: 8-22 mins                    G Codes:      Rada HayHill, Veida Spira Elizabeth 03/05/2016, 3:47 PM Blanchard KelchKaren Eliaz Fout PT [phone]

## 2016-03-05 NOTE — Clinical Social Work Note (Signed)
Clinical Social Work Assessment  Patient Details  Name: Laura Sanchez MRN: [id number] Date of Birth: 01/26/1924  Date of referral:  03/05/16               Reason for consult:  Facility Placement                Permission sought to share information with:  Case Manager, Facility Contact Representative, Family Supports Permission granted to share information::  Yes, Verbal Permission Granted  Name::        Agency::  SNF placement  Relationship::  Daughter in room at time of assessment Jean  Contact Information:     Housing/Transportation Living arrangements for the past 2 months:  Single Family Home Source of Information:  Patient, Medical Team, Adult Children Patient Interpreter Needed:  None Criminal Activity/Legal Involvement Pertinent to Current Situation/Hospitalization:  No - Comment as needed Significant Relationships:  Adult Children, Community Support, Other Family Members Lives with:  Self Do you feel safe going back to the place where you live?  No Need for family participation in patient care:  Yes (Comment)  Care giving concerns:  LCSW met with patient daughter and patient at the bedside.  Also please see notes from ED SW.   Patient does have active hoarding and home is no conducive for return per police and family. Aware of need for more supervision even though patient is alert and oriented.  Family lives in [place] and cannot see patient daily.  Safety is a concern and daughter reports patient can be stubborn with following treatment plans or recommendations.   Agreeable to SNF recommendation at this time and wanting [facility].   Patient has current falls 2x in the last week, secondary to medical conditions. She lives alone with limited 24 hours support.   Social Worker assessment / plan:  LCSW met with patient and daughter, explained role in hospital as well as reason for consult. Daughter very assertive and agreeable to SNF placement. Reports her Uncle is already a  resident at [facility], and she would like patient to also go to [facility]. Patient agreeable to plan as well, but hopeful for return home if strong enough. This will pt's first admission to SNF. [facility]PS was also called by ED SW and this is a pending referral.  Unknown outcome of APS investigation, but this will not delay dc as patient is safe and working towards SNF placement.  [facility] would most likely appreciate a call with an update. Will let CSW know and follow up. FL2 updated SNF work up completed and on Monday, CSW to follow up with bed offers.  Employment status:  Retired Insurance information:  Medicare PT Recommendations:  Skilled Nursing Facility Information / Referral to community resources:  [facility] (Comment Required: County, Name & Number of worker spoken with), Skilled Nursing Facility  Patient/Family's Response to care:  Agreeable to plan  Patient/Family's Understanding of and Emotional Response to Diagnosis, Current Treatment, and Prognosis:  Daughter is aware of current medical problems and needs for more supervision at this time. She is reasonable and very talkative regarding assessment of needs and disposition.  She requests at facility a hair stylist for her mother as she needs a trim.    Emotional Assessment Appearance:  Appears stated age Attitude/Demeanor/Rapport:  Other (cooperative) Affect (typically observed):  Accepting, Adaptable, Pleasant Orientation:  Oriented to Self, Oriented to Place, Oriented to  Time, Oriented to Situation Alcohol / Substance use:  Never Used Psych involvement (Current and /  or in the community):  No (Comment)  Discharge Needs  Concerns to be addressed:  No discharge needs identified Readmission within the last 30 days:  No Current discharge risk:  None Barriers to Discharge:  Continued Medical Work up   Coble, Hannah N, LCSW 03/05/2016, 1:58 PM  

## 2016-03-06 DIAGNOSIS — T796XXA Traumatic ischemia of muscle, initial encounter: Secondary | ICD-10-CM | POA: Insufficient documentation

## 2016-03-06 DIAGNOSIS — L8990 Pressure ulcer of unspecified site, unspecified stage: Secondary | ICD-10-CM

## 2016-03-06 LAB — T3, FREE: T3 FREE: 1.6 pg/mL — AB (ref 2.0–4.4)

## 2016-03-06 MED ORDER — AZITHROMYCIN 250 MG PO TABS
250.0000 mg | ORAL_TABLET | Freq: Every day | ORAL | Status: DC
Start: 2016-03-06 — End: 2016-03-20

## 2016-03-06 MED ORDER — ALBUTEROL SULFATE (2.5 MG/3ML) 0.083% IN NEBU: 2.5000 mg | INHALATION_SOLUTION | Freq: Four times a day (QID) | RESPIRATORY_TRACT | Status: DC | PRN

## 2016-03-06 MED ORDER — CEFPODOXIME PROXETIL 100 MG PO TABS: 100.0000 mg | ORAL_TABLET | Freq: Two times a day (BID) | ORAL | Status: DC

## 2016-03-06 MED ORDER — AZITHROMYCIN 250 MG PO TABS: 250.0000 mg | ORAL_TABLET | Freq: Every day | ORAL | Status: DC

## 2016-03-06 NOTE — Evaluation (Signed)
Clinical/Bedside Swallow Evaluation Patient Details  Name: Laura Sanchez MRN: [id number] Date of Birth: 07/01/1924  Today's Date: 03/06/2016 Time: SLP Start Time (ACUTE ONLY): 1450 SLP Stop Time (ACUTE ONLY): 1515 SLP Time Calculation (min) (ACUTE ONLY): 25 min  Past Medical History: History reviewed. No pertinent past medical history. Past Surgical History: History reviewed. No pertinent past surgical history. HPI:  82 yo female [facility] after being found down in her home - was down for 3 days.  Pt diagnosed with rhabdomylosis.  Pt admits to h/o dysphagia, stating she frequently chokes on liquids and sensing food lodging in throat - pointing to right side.   Pt states she choked terribly on calcium  a few years ago and she was worried she would "choke to death".  Progressive dysphagia x 3 months reported.  Swallow eval ordered.     Assessment / Plan / Recommendation Clinical Impression  Pt reports chronic dysphagia that has worsened within the last few weeks per pt.   Possible pharyngeal and cervical esophageal dysphagia present based on pt's symptoms.  Pt reports sensation of food lodging in throat pointing to right side of throat.  She also states liquids "come back up" if she consumes too many of them.   Pt was eating at 30* upon SLP entrance into room, advised her to precautions to mitigate dysphagia using teack back and written precautions..  MBS indicated next date to allow instrumental swallow evaluation given dysphagia.  Pt agreeable to plan, RN informed and orders placed.  .     Aspiration Risk  Risk for inadequate nutrition/hydration;Severe aspiration risk    Diet Recommendation Dysphagia 3 (Mech soft);Thin liquid   Liquid Administration via: Cup;Straw Medication Administration: Crushed with puree Supervision: Patient able to self feed Compensations: Slow rate;Small sips/bites;Follow solids with liquid Postural Changes: Remain upright for at least 30 minutes after po  intake;Seated upright at 90 degrees    Other  Recommendations Oral Care Recommendations: Oral care BID   Follow up Recommendations  Skilled Nursing facility    Frequency and Duration min 2x/week  2 weeks       Prognosis Prognosis for Safe Diet Advancement: Fair Barriers to Reach Goals: Severity of deficits;Cognitive deficits      Swallow Study   General Date of Onset: 03/06/16 HPI: 82 [facility] female [facility] being found down in her home - was down for 3 days.  Pt diagnosed with rhabdomylosis.  Pt admits to h/o dysphagia, stating she frequently chokes on liquids and sensing food lodging in throat - pointing to right side.   Pt states she choked terribly on calcium  a few years ago and she was worried she would "choke to death".  Progressive dysphagia x 3 months reported.  Swallow eval ordered.   Type of Study: Bedside Swallow Evaluation Diet Prior to this Study: Dysphagia 3 (soft);Thin liquids Temperature Spikes Noted: No Respiratory Status: Nasal cannula History of Recent Intubation: No Behavior/Cognition: Alert;Cooperative;Pleasant mood Oral Cavity Assessment: Within Functional Limits Oral Care Completed by SLP: Yes Oral Cavity - Dentition: Poor condition;Other (Comment) (few numbs only ) Vision: Functional for self-feeding Self-Feeding Abilities: Able to feed self Patient Positioning: Upright in bed Baseline Vocal Quality: Hoarse (pt reports h/o vocal fold surgery for nodules) Volitional Cough: Weak Volitional Swallow: Able to elicit    Oral/Motor/Sensory Function Overall Oral Motor/Sensory Function: Within functional limits   Ice Chips Ice chips: Not tested   Thin Liquid Thin Liquid: Impaired Presentation: Straw;Cup Pharyngeal  Phase Impairments: Throat  Clearing - Immediate;Throat Clearing - Delayed;Multiple swallows Other Comments: belching noted    Nectar Thick Nectar Thick Liquid: Not tested   Honey Thick Honey Thick Liquid: Not tested   Puree Puree:  Impaired Presentation: Self Fed;Spoon Other Comments: pt took very small boluses only approximately 1/3 tsp    Solid   GO   Solid: Not tested Other Comments: pt with meat/green beans on plate that she can not masticate due to poor dentition        Donavan Burnetamara Hilton Saephan, MS [facility] SLP [phone]

## 2016-03-06 NOTE — Discharge Instructions (Signed)
Follow with Primary MD in 7 days   Get CBC, CMP, 2 view Chest X ray checked  by SNF MD in 5 days.    Activity: As tolerated with Full fall precautions use walker/cane & assistance as needed   Disposition SNF   Diet:   Soft with feeding assistance and aspiration precautions.  For Heart failure patients - Check your Weight same time everyday, if you gain over 2 pounds, or you develop in leg swelling, experience more shortness of breath or chest pain, call your Primary MD immediately. Follow Cardiac Low Salt Diet and 1.5 lit/day fluid restriction.   On your next visit with your primary care physician please Get Medicines reviewed and adjusted.   Please request your Prim.MD to go over all Hospital Tests and Procedure/Radiological results at the follow up, please get all Hospital records sent to your Prim MD by signing hospital release before you go home.   If you experience worsening of your admission symptoms, develop shortness of breath, life threatening emergency, suicidal or homicidal thoughts you must seek medical attention immediately by calling 911 or calling your MD immediately  if symptoms less severe.  You Must read complete instructions/literature along with all the possible adverse reactions/side effects for all the Medicines you take and that have been prescribed to you. Take any new Medicines after you have completely understood and accpet all the possible adverse reactions/side effects.   Do not drive, operate heavy machinery, perform activities at heights, swimming or participation in water activities or provide baby sitting services if your were admitted for syncope or siezures until you have seen by Primary MD or a Neurologist and advised to do so again.  Do not drive when taking Pain medications.    Do not take more than prescribed Pain, Sleep and Anxiety Medications  Special Instructions: If you have smoked or chewed Tobacco  in the last 2 yrs please stop smoking,  stop any regular Alcohol  and or any Recreational drug use.  Wear Seat belts while driving.   Please note  You were cared for by a hospitalist during your hospital stay. If you have any questions about your discharge medications or the care you received while you were in the hospital after you are discharged, you can call the unit and asked to speak with the hospitalist on call if the hospitalist that took care of you is not available. Once you are discharged, your primary care physician will handle any further medical issues. Please note that NO REFILLS for any discharge medications will be authorized once you are discharged, as it is imperative that you return to your primary care physician (or establish a relationship with a primary care physician if you do not have one) for your aftercare needs so that they can reassess your need for medications and monitor your lab values.

## 2016-03-06 NOTE — Progress Notes (Signed)
Patient had foley catheter removed at 1430 today. Patient is due to void but has not done so yet. Patient does not have the urge to void.  Bladder scan yielded 187 ml. PCP on call was notified.

## 2016-03-06 NOTE — Progress Notes (Signed)
Physical Therapy Treatment Patient Details Name: Reubin Milanellie Throgmorton MRN: [id number] DOB: 02/07/1924 Today's Date: 03/06/2016    History of Present Illness 82 yo female admitted with rhabdomyolysis after sustaining a fall at home. Pt was down for 3 days. Hx of chronic ankle wound.     PT Comments    Progressing slowly with mobility. Pt reports she fees she is regressing instead of improving. Skin looks pale. Pt c/o lightheadedness/dizziness with initial sitting and with initial standing. Continue to recommend SNF for rehab once medically stable.   Follow Up Recommendations  SNF     Equipment Recommendations  Rolling walker with 5" wheels (youth height)    Recommendations for Other Services       Precautions / Restrictions Precautions Precautions: Fall Precaution Comments: bowel incontinence Restrictions Weight Bearing Restrictions: No    Mobility  Bed Mobility Overal bed mobility: Needs Assistance Bed Mobility: Supine to Sit     Supine to sit: HOB elevated;Mod assist     General bed mobility comments: assist for trunk and to get to EOB. Increased time.   Transfers Overall transfer level: Needs assistance Equipment used: Rolling walker (2 wheeled) Transfers: Sit to/from Stand Sit to Stand: Min assist Stand pivot transfers: Min assist       General transfer comment: Assist to rise, stabilize, control descent. VCs safety, technqiue, hand placement.  stood x 4 minutes for pericare. Incontinent of BM.Stand pivot, bed to recliner, with RW.   Ambulation/Gait Ambulation: Min assist +2 safety/equipment Distance: 5 feet Assistive device: Rolling walker (2 wheeled) Gait Pattern/deviations: Decreased stride length;Decreased step length - right;Decreased step length - left;Trunk flexed     General Gait Details: After stand pivot, pt walked to door. Close follow with recliner. Remained on 4L [place] O2. Dyspnea 3/4   Stairs            Wheelchair Mobility    Modified  Rankin (Stroke Patients Only)       Balance           Standing balance support: During functional activity                        Cognition Arousal/Alertness: Awake/alert Behavior During Therapy: WFL for tasks assessed/performed Overall Cognitive Status: Within Functional Limits for tasks assessed                      Exercises      General Comments        Pertinent Vitals/Pain Pain Assessment: Faces Faces Pain Scale: Hurts even more Pain Location: "all over" but especially legs, back Pain Descriptors / Indicators: Sore;Tender Pain Intervention(s): Limited activity within patient's tolerance;Repositioned    Home Living                      Prior Function            PT Goals (current goals can now be found in the care plan section) Progress towards PT goals: Progressing toward goals (very slowly)    Frequency  Min 3X/week    PT Plan Current plan remains appropriate    Co-evaluation             End of Session Equipment Utilized During Treatment: Gait belt;Oxygen Activity Tolerance: Patient limited by fatigue Patient left: in chair;with call bell/phone within reach;with chair alarm set     Time: 1520-1541 PT Time Calculation (min) (ACUTE ONLY): 21 min  Charges:  $Therapeutic Activity: 8-22  mins                    G Codes:      Weston Anna, MPT Pager: [phone]

## 2016-03-06 NOTE — Clinical Social Work Placement (Signed)
CSW provided bed offers to pt and confirmed bed offer [facility].   CLINICAL SOCIAL WORK PLACEMENT  NOTE  Date:  03/06/2016  Patient Details  Name: Laura Sanchez MRN: [id number] Date of Birth: 02/04/1924  Clinical Social Work is seeking post-discharge placement for this patient at the Skilled  Nursing Facility level of care (*CSW will initial, date and re-position this form in  chart as items are completed):  Yes   Patient/family provided with [facility] Clinical Social Work Department's list of facilities offering this level of care within the geographic area requested by the patient (or if unable, by the patient's family).  Yes   Patient/family informed of their freedom to choose among providers that offer the needed level of care, that participate in Medicare, Medicaid or managed care program needed by the patient, have an available bed and are willing to accept the patient.      Patient/family informed of [facility]'s ownership interest in [facility] and [facility], as well as of the fact that they are under no obligation to receive care at these facilities.  PASRR submitted to EDS on 03/05/16     PASRR number received on 03/05/16     Existing PASRR number confirmed on 03/05/16     FL2 transmitted to all facilities in geographic area requested by pt/family on 03/05/16     FL2 transmitted to all facilities within larger geographic area on       Patient informed that his/her managed care company has contracts with or will negotiate with certain facilities, including the following:        Yes   Patient/family informed of bed offers received.  [facility]     Physician recommends and patient chooses bed at      [facility]  .  Patient to be transferred to facility by       Patient family notified on   of transfer.  Name of family member notified:        PHYSICIAN Please sign FL2, Please prepare  prescriptions     Additional Comment:    _______________________________________________ Donnie CoffinErin M Avyanna Spada, LCSW 03/06/2016, 1:30 PM
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
## 2016-03-06 NOTE — Care Management Note (Signed)
Case Management Note  Patient Details  Name: Laura Sanchez MRN: [id number] Date of Birth: 08/08/1924  Subjective/Objective:  82 y/o f admitted w/PNA, rhabdomyolysis. From home. D/c plan SNF.                  Action/Plan:d/c SNF.   Expected Discharge Date:                Expected Discharge Plan:  Skilled Nursing Facility  In-House Referral:  Clinical Social Work  Discharge planning Services  CM Consult  Post Acute Care Choice:    Choice offered to:     DME Arranged:    DME Agency:     HH Arranged:    HH Agency:     Status of Service:  Completed, signed off  If discussed at [employer] Meetings, dates discussed:    Additional Comments:  Lanier ClamMahabir, [name], RN 03/06/2016, 11:27 AM

## 2016-03-07 ENCOUNTER — Inpatient Hospital Stay (HOSPITAL_COMMUNITY): Payer: Medicare Other

## 2016-03-07 DIAGNOSIS — J189 Pneumonia, unspecified organism: Secondary | ICD-10-CM

## 2016-03-07 NOTE — Progress Notes (Signed)
[facility] called for transport.     Yukio Bisping, LCSW [facility] Clinical Social Worker cell #: [phone]  

## 2016-03-07 NOTE — Progress Notes (Signed)
MBS completed, full report to follow.  Pt with moderate oropharyngeal and suspected primary esophageal deficits.  No aspiration noted but decreased oral control/weakness results in delayed oral transiting.  Decreased tongue base retraction and weak pharyngeal contraction results in pt having pharyngeal residuals without consistent awareness.  Pt did "hock" and expectorate x2 during testing, effectively clearing solid food vallecular residuals that she did no clear with liquid swallows.  Following solids with liquids faciliates clearance with approximately 75% of opportunities.  Chin down posture not effective.  Using live monitor, pt educated to findings and compensation strategies reinforced.   Upon esophageal sweep early in study, pt appeared with decreased clearance, ? Tortuous esophagus - without awareness.  Liquid swallows definitely facilitate esophageal clearance of solids.  Pt took barium tablet with pudding and it readily transited into esophagus.  Pt however reported issue with sensing she was "choking" on tablet, barium tablet appeared lodged in esophagus near aortic arch at that time.  Further boluses of liquid, pudding, warm water did not clear it - although pt stated she was "thankful it went down".  Suspect multifactorial dysphagia with contribution of esophageal issues - ? Consistent with esophageal dysmotility with exacerbation from pt positioning/kyphosis.    Recommend continue soft/thin diet with pills crushed and strict precautions.   Follow solids with liquids "Hock" and expectorate to clear vallecular residuals  Small frequent meals Stay upright 60 minutes after meals  Follow up SLP at SNF recommended to further help pt mitigate dysphagia.  Thanks for this referral.  Donavan Burnetamara Jozlyn Schatz, MS [facility] SLP [phone]

## 2016-03-07 NOTE — Clinical Social Work Placement (Signed)
Patient is set to [facility] today. Patient & daughter, Carney BernJean made aware. Discharge packet given to RN, Edison PaceKeishonna. [facility] will be called for transport once admission paperwork has been completed by daughter, Carney BernJean with E[facility]ol[facility].     Lincoln MaxinKelly Euretha Najarro, LCSW [facility] Clinical Social Worker cell #: [phone]    CLINICAL SOCIAL WORK PLACEMENT  NOTE  Date:  03/07/2016  Patient Details  Name: Laura Sanchez MRN: [id number] Date of Birth: 03/21/1924  Clinical Social Work is seeking post-discharge placement for this patient at the Skilled  Nursing Facility level of care (*CSW will initial, date and re-position this form in  chart as items are completed):  Yes   Patient/family provided with [place] [facility] Clinical Social Work Department's list of facilities offering this level of care within the geographic area requested by the patient (or if unable, by the patient's family).  Yes   Patient/family informed of their freedom to choose among providers that offer the needed level of care, that participate in Medicare, Medicaid or managed care program needed by the patient, have an available bed and are willing to accept the patient.      Patient/family informed of [facility]'s ownership interest in [facility] and [facility], as well as of the fact that they are under no obligation to receive care at these facilities.  PASRR submitted to EDS on 03/05/16     PASRR number received on 03/05/16     Existing PASRR number confirmed on 03/05/16     FL2 transmitted to all facilities in geographic area requested by pt/family on 03/05/16     FL2 transmitted to all facilities within larger geographic area on       Patient informed that his/her managed care company has contracts with or will negotiate with certain facilities, including the following:        Yes   Patient/family informed of bed offers received.  [facility]     Physician recommends and patient chooses bed at      [facility] 03/07/16.  Patient to be transferred to facility by [facility]     Patient family notified on 03/07/16 of transfer.  Name of family member notified:  patient's daughter, Carney BernJean via phone     PHYSICIAN Please sign FL2, Please prepare prescriptions     Additional Comment:    _______________________________________________ Arlyss RepressHarrison, Arlinda Barcelona F, LCSW 03/07/2016, 11:05 AM

## 2016-03-07 NOTE — Progress Notes (Signed)
Daughter in room with patient, [facility] to transport to SNF, IV removed prior to transport, personal belongings with pt's daughter

## 2016-03-07 NOTE — Care Management Important Message (Signed)
Important Message  Patient Details  Name: Laura Sanchez MRN: [id number] Date of Birth: 09/16/1923   Medicare Important Message Given:  Yes    Haskell FlirtJamison, Mahum Betten 03/07/2016, 9:13 AMImportant Message  Patient Details  Name: Laura Sanchez MRN: [id number] Date of Birth: 07/05/1924   Medicare Important Message Given:  Yes    Haskell FlirtJamison, Tyree Vandruff 03/07/2016, 9:12 AM

## 2016-03-07 NOTE — Progress Notes (Signed)
Upon the RN's assessment tonight, there was an irregular distended area in the RLQ.  PCP was notified. Patient stated that there was no pain associated with this distended area.

## 2016-03-09 ENCOUNTER — Telehealth: Payer: Self-pay | Admitting: Cardiology

## 2016-03-09 NOTE — Telephone Encounter (Signed)
Called pt's daughter Carney BernJean and left message asking daughter to give our office a call back to give information concerning pt's medical history and what providers pt has saw in the past, before ED visit.

## 2016-03-10 ENCOUNTER — Encounter: Payer: Self-pay | Admitting: Internal Medicine

## 2016-03-10 ENCOUNTER — Non-Acute Institutional Stay (SKILLED_NURSING_FACILITY): Payer: Medicare Other | Admitting: Internal Medicine

## 2016-03-10 DIAGNOSIS — T796XXS Traumatic ischemia of muscle, sequela: Secondary | ICD-10-CM | POA: Diagnosis not present

## 2016-03-10 DIAGNOSIS — D638 Anemia in other chronic diseases classified elsewhere: Secondary | ICD-10-CM | POA: Diagnosis not present

## 2016-03-10 DIAGNOSIS — R5381 Other malaise: Secondary | ICD-10-CM

## 2016-03-10 DIAGNOSIS — L8990 Pressure ulcer of unspecified site, unspecified stage: Secondary | ICD-10-CM | POA: Diagnosis not present

## 2016-03-10 DIAGNOSIS — I350 Nonrheumatic aortic (valve) stenosis: Secondary | ICD-10-CM

## 2016-03-10 DIAGNOSIS — I5032 Chronic diastolic (congestive) heart failure: Secondary | ICD-10-CM

## 2016-03-10 DIAGNOSIS — E871 Hypo-osmolality and hyponatremia: Secondary | ICD-10-CM | POA: Diagnosis not present

## 2016-03-10 DIAGNOSIS — R296 Repeated falls: Secondary | ICD-10-CM | POA: Diagnosis not present

## 2016-03-10 DIAGNOSIS — E43 Unspecified severe protein-calorie malnutrition: Secondary | ICD-10-CM

## 2016-03-10 DIAGNOSIS — Z9181 History of falling: Secondary | ICD-10-CM

## 2016-03-10 DIAGNOSIS — E038 Other specified hypothyroidism: Secondary | ICD-10-CM

## 2016-03-10 DIAGNOSIS — E039 Hypothyroidism, unspecified: Secondary | ICD-10-CM

## 2016-03-10 DIAGNOSIS — J189 Pneumonia, unspecified organism: Secondary | ICD-10-CM | POA: Diagnosis not present

## 2016-03-10 DIAGNOSIS — R1310 Dysphagia, unspecified: Secondary | ICD-10-CM | POA: Diagnosis not present

## 2016-03-10 NOTE — Progress Notes (Signed)
LOCATION: [facility]  PCP: No primary care provider on file.   Code Status:  Full Code  Goals of care: Advanced Directive information Advanced Directives 03/03/2016  Does patient have an advance directive? No       Extended Emergency Contact Information Primary Emergency Contact: Tobi [place] Mobile Phone: [phone] Relation: Daughter   No Known Allergies  Chief Complaint  Patient presents with  . New Admit To SNF    New Admission     HPI:  Patient is a 82 y.o. female seen today for short term rehabilitation post hospital admission from 03/03/16-03/07/16 with acute respiratory failure from community acquired pneumonia and acute chf exacerbation post iv fluids. She was treated with antibiotics. She had rhabdomylosis post fall and ARF and had received iv fluids. She was found to have critical AS and moderate pulmonary HTN and is to follow with cardiology. She is seen in her room today.   Review of Systems:  Constitutional: Negative for fever, chills and diaphoresis. she feels weak and tired.  HENT: Negative for headache, congestion. Has occasional difficulty swallowing. Positive for nasal discharge. Has hearing loss. Eyes: Negative for eye pain and discharge. has poor vision Respiratory: Negative for wheezing. Positive for dry cough and shortness of breath.   Cardiovascular: Negative for chest pain, palpitations, leg swelling.  Gastrointestinal: Negative for heartburn, nausea, vomiting. Positive for abdominal discomfort with meals. Patient was unsure of her last bowel movement. .  Genitourinary: Negative for dysuria and flank pain.  Musculoskeletal: Negative for fall in the facility.  Skin: Negative for itching, rash.  Neurological: Negative for dizziness. Psychiatric/Behavioral: Negative for depression.   No past medical history on file. No past surgical history on file. Social History:   reports that she has never smoked. She does  not have any smokeless tobacco history on file. She reports that she does not drink alcohol. Her drug history is not on file.  No family history on file.  Medications:   Medication List       This list is accurate as of: 03/10/16 12:03 PM.  Always use your most recent med list.               albuterol (2.5 MG/3ML) 0.083% nebulizer solution  Commonly known as:  PROVENTIL  Take 3 mLs (2.5 mg total) by nebulization every 6 (six) hours as needed for wheezing or shortness of breath.     aspirin EC 325 MG tablet  Take 325 mg by mouth daily as needed for moderate pain.     azithromycin 250 MG tablet  Commonly known as:  ZITHROMAX  Take 1 tablet (250 mg total) by mouth daily. For 3 more days     BENGAY EX  Apply 1 application topically daily as needed (pain.).     CALTRATE GUMMY BITES 250-400 MG-UNIT Chew  Generic drug:  Ca Phosphate-Cholecalciferol  Chew 2 each by mouth daily.     cefpodoxime 100 MG tablet  Commonly known as:  VANTIN  Take 1 tablet (100 mg total) by mouth 2 (two) times daily. For 3 more days     multivitamin with iron-minerals liquid  Take 15 mLs by mouth daily.     OXYGEN  Inhale 3 L into the lungs continuous.        Immunizations:  There is no immunization history on file for this patient.   Physical Exam: Filed Vitals:   03/10/16 1159  BP: 112/58  Pulse: 72  Temp:  98.4 F (36.9 C)  TempSrc: Oral  Resp: 18  Height: 5\' 4"  (1.626 m)  Weight: 105 lb 14.4 oz (48.036 kg)  SpO2: 99%   Body mass index is 18.17 kg/(m^2).  General- elderly female, frail and thin built, in no acute distress Head- normocephalic, atraumatic Nose- no maxillary or frontal sinus tenderness, no nasal discharge Throat- moist mucus membrane, poor dentition  Eyes- PERRLA, EOMI, no pallor, no icterus Neck- no cervical lymphadenopathy Cardiovascular- normal s1,s2, + murmur Respiratory- bilateral poor air entry, wheezing present with occasional rhonchi, short of breath in  between sentences, on 3 l o2 by [place] Abdomen- bowel sounds present, soft, non tender Musculoskeletal- able to move all 4 extremities, generalized weakness Neurological- alert and oriented to person, place and time Skin- warm and dry, has floaters to both heels, stage 2  Pressure ulcer to mid spine and right scapula, left lateral malleolus with unstageable pressure ulcer Psychiatry- normal mood and affect    Labs reviewed: Basic Metabolic Panel:  Recent Labs  91/47/8206/24/17 0039 03/04/16 0625 03/05/16 0510  NA 133* 133* 134*  K 3.5 3.1* 4.2  CL 91* 97* 100*  CO2  --  28 29  GLUCOSE 96 118* 108*  BUN 25* 24* 25*  CREATININE 0.90 0.68 0.71  CALCIUM  --  7.9* 7.9*  MG  --  1.9  --    Liver Function Tests:  Recent Labs  03/04/16 0625  AST 55*  ALT 29  ALKPHOS 45  BILITOT 2.3*  PROT 5.6*  ALBUMIN 2.8*   No results for input(s): LIPASE, AMYLASE in the last 8760 hours. No results for input(s): AMMONIA in the last 8760 hours. CBC:  Recent Labs  03/03/16 0030 03/04/16 0039 03/04/16 0625 03/05/16 0510  WBC 13.9*  --  12.1* 10.5  NEUTROABS 11.6*  --   --   --   HGB 9.4* 9.5* 9.7* 8.5*  HCT 27.6* 28.0* 28.7* 25.4*  MCV 92.0  --  89.7 93.4  PLT 292  --  298 290   Cardiac Enzymes:  Recent Labs  03/03/16 0030 03/04/16 0246 03/04/16 0625 03/04/16 1129 03/05/16 0510  CKTOTAL 758* 696*  --   --  247*  CKMB 13.9*  --   --   --  8.3*  TROPONINI  --   --  0.22*  0.23* 0.19*  --    BNP: Invalid input(s): POCBNP CBG: No results for input(s): GLUCAP in the last 8760 hours.  Radiological Exams: Dg Chest 1 View  03/04/2016  CLINICAL DATA:  Oxygen deficiency today. EXAM: CHEST 1 VIEW COMPARISON:  Chest x-ray dated 03/03/2016. FINDINGS: Comparison to yesterday's x-ray is limited by patient rotation. Cardiomediastinal silhouette appears grossly stable in size and configuration. Dense opacity again noted at the left lung base. Ill-defined opacity at the right lung base is likely  a combination of pleural effusion and consolidation. Overall, no significant change. Atherosclerotic changes again noted at the aortic arch. IMPRESSION: 1. No significant change compared to yesterday's chest x-ray. Bibasilar opacities, left greater than right, could represent pneumonia or atelectasis/airspace collapse. Right pleural effusion appears stable, small to moderate in size. 2. Aortic atherosclerosis. Electronically Signed   By: Bary RichardStan  Maynard M.D.   On: 03/04/2016 14:33   Dg Chest 2 View  03/04/2016  CLINICAL DATA:  Status post fall, with pain at the left humerus. Initial encounter. EXAM: CHEST  2 VIEW COMPARISON:  None. FINDINGS: A moderate right-sided pleural effusion is noted, with patchy bilateral airspace opacity. This may reflect  pneumonia or pulmonary edema. No pneumothorax is seen. The cardiomediastinal silhouette remains normal in size. No acute osseous abnormalities are identified. There is diffuse osteopenia of visualized osseous structures. Clips are noted within the right upper quadrant, reflecting prior cholecystectomy. IMPRESSION: Moderate right-sided pleural effusion, with patchy bilateral airspace opacity. This may reflect pneumonia or pulmonary edema. Electronically Signed   By: Roanna Raider M.D.   On: 03/04/2016 00:35   Ct Head Wo Contrast  03/04/2016  CLINICAL DATA:  82 year old female with fall EXAM: CT HEAD WITHOUT CONTRAST CT CERVICAL SPINE WITHOUT CONTRAST TECHNIQUE: Multidetector CT imaging of the head and cervical spine was performed following the standard protocol without intravenous contrast. Multiplanar CT image reconstructions of the cervical spine were also generated. COMPARISON:  None. FINDINGS: CT HEAD FINDINGS The ventricles and sulci are mildly prominent compatible with mild age-related atrophy. Mild moderate periventricular and deep white matter chronic microvascular ischemic changes noted. There is no acute intracranial hemorrhage. No mass effect or midline shift  noted. The visualized paranasal sinuses and mastoid air cells are clear. The calvarium is intact. CT CERVICAL SPINE FINDINGS There is no acute fracture or subluxation of the cervical spine.There is osteopenia with multilevel degenerative changes of the spine.The odontoid and spinous processes are intact.There is normal anatomic alignment of the C1-C2 lateral masses. The visualized soft tissues appear unremarkable. Partially visualized right and probable left pleural effusions. There is biapical scarring with increased interstitial septal prominence likely a degree of edema. IMPRESSION: No acute intracranial hemorrhage. No acute/ traumatic cervical spine pathology. Electronically Signed   By: Elgie Collard M.D.   On: 03/04/2016 00:50   Ct Cervical Spine Wo Contrast  03/04/2016  CLINICAL DATA:  82 year old female with fall EXAM: CT HEAD WITHOUT CONTRAST CT CERVICAL SPINE WITHOUT CONTRAST TECHNIQUE: Multidetector CT imaging of the head and cervical spine was performed following the standard protocol without intravenous contrast. Multiplanar CT image reconstructions of the cervical spine were also generated. COMPARISON:  None. FINDINGS: CT HEAD FINDINGS The ventricles and sulci are mildly prominent compatible with mild age-related atrophy. Mild moderate periventricular and deep white matter chronic microvascular ischemic changes noted. There is no acute intracranial hemorrhage. No mass effect or midline shift noted. The visualized paranasal sinuses and mastoid air cells are clear. The calvarium is intact. CT CERVICAL SPINE FINDINGS There is no acute fracture or subluxation of the cervical spine.There is osteopenia with multilevel degenerative changes of the spine.The odontoid and spinous processes are intact.There is normal anatomic alignment of the C1-C2 lateral masses. The visualized soft tissues appear unremarkable. Partially visualized right and probable left pleural effusions. There is biapical scarring with  increased interstitial septal prominence likely a degree of edema. IMPRESSION: No acute intracranial hemorrhage. No acute/ traumatic cervical spine pathology. Electronically Signed   By: Elgie Collard M.D.   On: 03/04/2016 00:50   Dg Humerus Left  03/04/2016  CLINICAL DATA:  82 year old female with fall and left upper extremity pain. EXAM: LEFT HUMERUS - 2+ VIEW COMPARISON:  None. FINDINGS: No definite acute fracture or dislocation identified. There is advanced osteopenia which limits evaluation for fracture. There is degenerative changes of the shoulder. The soft tissues appear unremarkable. No radiopaque foreign object identified. IMPRESSION: No acute fracture or dislocation. Electronically Signed   By: Elgie Collard M.D.   On: 03/04/2016 00:36   Dg Swallowing Func-speech Pathology  03/07/2016  Objective Swallowing Evaluation: Type of Study: MBS-Modified Barium Swallow Study Patient Details Name: Julea Hutto MRN: [id number] Date of Birth: Mar 11, 1924 Today's  Date: 03/07/2016 Time: SLP Start Time (ACUTE ONLY): 1218-SLP Stop Time (ACUTE ONLY): 1250 SLP Time Calculation (min) (ACUTE ONLY): 32 min Past Medical History: No past medical history on file. Past Surgical History: No past surgical history on file. HPI: 82 [facility] being [facility] down in her home - was down for 3 days.  Pt diagnosed with rhabdomylosis.  Pt admits to h/o dysphagia, stating she frequently chokes on liquids and sensing food lodging in throat - pointing to right side.   Pt states she choked terribly on calcium  a few years ago and she was worried she would "choke to death".  Progressive dysphagia x 3 months reported.  Swallow eval ordered.  Pt also admits to having throat surgery done many years ago for nodules.   Subjective: pt alert Assessment / Plan / Recommendation CHL IP CLINICAL IMPRESSIONS 03/07/2016 Therapy Diagnosis Moderate oral phase dysphagia;Moderate pharyngeal phase dysphagia;Suspected primary esophageal  dysphagia Clinical Impression MBS completed, full report to follow.  Pt with moderate oropharyngeal and suspected primary esophageal deficits.  No aspiration noted but decreased oral control/weakness results in delayed oral transiting.  Decreased tongue base retraction and weak pharyngeal contraction results in pt having pharyngeal residuals without consistent awareness.  Pt did "hock" and expectorate x2 during testing, effectively clearing solid food vallecular residuals that she did no clear with liquid swallows.  Following solids with liquids faciliates clearance with approximately 75% of opportunities.  Chin down posture not effective.  Using live monitor, pt educated to findings and compensation strategies reinforced. Upon esophageal sweep early in study, pt appeared with decreased clearance, ? Tortuous esophagus - without awareness. Liquid swallows definitely facilitate esophageal clearance of solids. Pt took barium tablet with pudding and it readily transited into esophagus. Pt however reported issue with sensing she was "choking" on tablet, barium tablet appeared lodged in esophagus near aortic arch at that time. Further boluses of liquid, pudding, warm water did not clear it - although pt stated she was "thankful it went down". Suspect multifactorial dysphagia with contribution of esophageal issues - ? Consistent with esophageal dysmotility with exacerbation from pt positioning/kyphosis. Recommend continue soft/thin diet with pills crushed and strict precautions.Follow solids with liquids Hock" and expectorate to clear vallecular residuals. small frequent meals.  Follow up SLP at SNF recommended to further help pt mitigate dysphagia. Thanks for this referral.   Impact on safety and function Moderate aspiration risk   CHL IP TREATMENT RECOMMENDATION 03/07/2016 Treatment Recommendations Therapy as outlined in treatment plan below   Prognosis 03/07/2016 Prognosis for Safe Diet Advancement Fair  Barriers to Reach Goals Severity of deficits;Cognitive deficits Barriers/Prognosis Comment -- CHL IP DIET RECOMMENDATION 03/07/2016 SLP Diet Recommendations Dysphagia 3 (Mech soft) solids;Thin liquid Liquid Administration via Cup;Straw Medication Administration Crushed with puree Compensations Slow rate;Small sips/bites;Follow solids with liquid Postural Changes Seated upright at 90 degrees;Remain semi-upright after after feeds/meals (Comment)   CHL IP OTHER RECOMMENDATIONS 03/07/2016 Recommended Consults -- Oral Care Recommendations Oral care BID Other Recommendations --   CHL IP FOLLOW UP RECOMMENDATIONS 03/06/2016 Follow up Recommendations [facility]QU[facility] D[facility] 03/07/2016 Speech Therapy Frequency (ACUTE ONLY) min 1 x/week Treatment Duration 1 week      CHL IP ORAL PHASE 03/07/2016 Oral Phase Impaired Oral - Pudding Teaspoon -- Oral - Pudding Cup -- Oral - Honey Teaspoon -- Oral - Honey Cup -- Oral - Nectar Teaspoon Weak lingual manipulation Oral - Nectar Cup Weak lingual manipulation;Delayed oral transit;Reduced posterior propulsion  Oral - Nectar Straw -- Oral - Thin Teaspoon Weak lingual manipulation;Delayed oral transit;Reduced posterior propulsion Oral - Thin Cup Weak lingual manipulation;Delayed oral transit;Reduced posterior propulsion Oral - Thin Straw Weak lingual manipulation;Delayed oral transit;Reduced posterior propulsion Oral - Puree Weak lingual manipulation;Delayed oral transit;Reduced posterior propulsion Oral - Mech Soft -- Oral - Regular Weak lingual manipulation;Delayed oral transit;Reduced posterior propulsion Oral - Multi-Consistency -- Oral - Pill Weak lingual manipulation;Delayed oral transit Oral Phase - Comment --  CHL IP PHARYNGEAL PHASE 03/07/2016 Pharyngeal Phase Impaired Pharyngeal- Pudding Teaspoon -- Pharyngeal -- Pharyngeal- Pudding Cup -- Pharyngeal -- Pharyngeal- Honey Teaspoon -- Pharyngeal -- Pharyngeal- Honey Cup -- Pharyngeal -- Pharyngeal- Nectar  Teaspoon Reduced tongue base retraction;Pharyngeal residue - valleculae Pharyngeal -- Pharyngeal- Nectar Cup Reduced tongue base retraction;Pharyngeal residue - valleculae Pharyngeal -- Pharyngeal- Nectar Straw -- Pharyngeal -- Pharyngeal- Thin Teaspoon Reduced tongue base retraction;Pharyngeal residue - pyriform;Pharyngeal residue - valleculae Pharyngeal -- Pharyngeal- Thin Cup Reduced tongue base retraction;Pharyngeal residue - valleculae;Pharyngeal residue - pyriform Pharyngeal -- Pharyngeal- Thin Straw Reduced tongue base retraction;Pharyngeal residue - pyriform;Pharyngeal residue - valleculae Pharyngeal -- Pharyngeal- Puree Reduced tongue base retraction Pharyngeal -- Pharyngeal- Mechanical Soft -- Pharyngeal -- Pharyngeal- Regular Reduced tongue base retraction;Pharyngeal residue - valleculae Pharyngeal -- Pharyngeal- Multi-consistency -- Pharyngeal -- Pharyngeal- Pill Reduced tongue base retraction;Pharyngeal residue - valleculae Pharyngeal -- Pharyngeal Comment --  CHL IP CERVICAL ESOPHAGEAL PHASE 03/07/2016 Cervical Esophageal Phase Impaired Pudding Teaspoon -- Pudding Cup -- Honey Teaspoon -- Honey Cup -- Nectar Teaspoon -- Nectar Cup -- Nectar Straw -- Thin Teaspoon -- Thin Cup -- Thin Straw -- Puree -- Mechanical Soft -- Regular -- Multi-consistency -- Pill -- Cervical Esophageal Comment appearance of prominent cricopharyngeus Donavan Burnet, MS [facility] SLP [phone]              Dg Hip Unilat With Pelvis 2-3 Views Left  03/04/2016  CLINICAL DATA:  Status post fall, with left hip pain. Initial encounter. EXAM: DG HIP (WITH OR WITHOUT PELVIS) 2-3V LEFT COMPARISON:  None. FINDINGS: There is no evidence of fracture or dislocation. Both femoral heads are seated normally within their respective acetabula. The proximal left femur appears intact. No significant degenerative change is appreciated. The sacroiliac joints are unremarkable in appearance. The visualized bowel gas pattern is grossly unremarkable in  appearance. IMPRESSION: No evidence of fracture or dislocation. Electronically Signed   By: Roanna Raider M.D.   On: 03/04/2016 00:36    Assessment/Plan  Physical deconditioning Will have her work with physical therapy and occupational therapy team to help with gait training and muscle strengthening exercises.fall precautions. Skin care. Encourage to be out of bed. Poor prognosis with her severe AS, deconditioning, pressure ulcer and malnutrition. Consider palliative care consult if no improvement with rehabilitation  CAP Continue and complete her antibiotic cefpodoxime 100 mg bid with azithromycin 250 mg daily today. To get cxr in 2 week to assess for resolution. Encourage to use incentive spirometer. She is a high aspiration risk. Continue o2 by nasal canula and bronchodilators  Pressure ulcer Multiple pressure ulcer with overall poor prognosis with her poor nutritional state. Continue wound care, air mattress and add decubivite  Dysphagia On pureed diet, aspiration precautions to be taken, get SLP consult  Protein calorie malnutrition Get RD consult, monitor weight 3 days a week. Continue multivitamin  Hyponatremia Monitor bmp  Chronic diastolic chf With exacerbation from iv fluids in hospital. Continue o2 for now. Monitor weight  Anemia of chronic disease Monitor cbc  High fall risk  Fall precaution for now  rhabdomylosis S/p iv fluids in hospital, check ck level  Aortic stenosis Symptomatic, get cardiology consult to assess for possible TAVR procedure  Subclinical hypothyroidism Lab Results  Component Value Date   TSH 4.759* 03/04/2016    Thought to be from her critical illness. Will get tsh with free t4 in 2 weeks   Goals of care: short term rehabilitation   Labs/tests ordered: cbc, cmp, ck, tsh, free t4, cxr  Family/ staff Communication: reviewed care plan with patient and nursing supervisor    Oneal Grout, MD Internal Medicine [facility] [address] [place], [place] [address] Cell Phone (Monday-Friday 8 am - 5 pm): [phone] On Call: [phone] and follow prompts after 5 pm and on weekends Office Phone: [phone] Office Fax: [phone]

## 2016-03-13 LAB — BASIC METABOLIC PANEL
BUN: 12 mg/dL (ref 4–21)
Creatinine: 0.6 mg/dL (ref 0.5–1.1)
GLUCOSE: 93 mg/dL
POTASSIUM: 5.2 mmol/L (ref 3.4–5.3)
SODIUM: 130 mmol/L — AB (ref 137–147)

## 2016-03-14 NOTE — Progress Notes (Signed)
Cardiology Office Note  NEW PATIENT VISIT   Date:  03/15/2016   ID:  Laura MilanNellie Sanchez, DOB 04/08/1924, MRN [id number]  PCP:  Oneal GroutPANDEY, MAHIMA, MD  Cardiologist:  NEW    Chief Complaint  Patient presents with  . Hospitalization Follow-up    for Critical AS.        History of Present Illness: Laura Sanchez is a 82 y.o. female who presents for post hospitalization 6/23-6/27 for a fall and in floor for 3 days.  She had CAP, traumatic rhabdomyolysis, cellitis and prolonged Qtc.  Also, incidental finding of Critical AS and Mod Pulm HTN on TTE - to be asymptomatic, outpatient follow-up with cardiology questioned TAVR procedure.  [facility].  . She has no significant PMH. She was found to be anemic at 9.4, leukocytosis, Qtc 625ms.Marland Kitchen.  ECHO: Study Conclusions  - Left ventricle: The cavity size was normal. There was moderate  concentric hypertrophy. Systolic function was vigorous. The  estimated ejection fraction was in the range of 65% to 70%. Wall  motion was normal; there were no regional wall motion  abnormalities. Doppler parameters are consistent with abnormal  left ventricular relaxation (grade 1 diastolic dysfunction).  Doppler parameters are consistent with elevated ventricular  end-diastolic filling pressure. - Aortic valve: Trileaflet; severely thickened, severely calcified  leaflets. Valve mobility was restricted. There was critical  stenosis. Peak gradient (S): 133 mm Hg. Valve area (VTI): 0.47  cm^2. Valve area (Vmax): 0.53 cm^2. Valve area (Vmean): 0.51  cm^2. - Mitral valve: Calcified annulus. Mildly thickened leaflets .  There was moderate regurgitation. - Left atrium: The atrium was mildly dilated. - Tricuspid valve: There was moderate regurgitation. - Pulmonary arteries: Systolic pressure was severely increased. PA  peak pressure: 57 mm Hg (S). - Inferior vena cava: The vessel was normal in size. - Pericardium,  extracardiac: There was no pericardial effusion.  Impressions:  - Critical aortic stenosis.  Normal LVEF.  Severe pulmonary hypertension.    Today pt complains of SOB at rest and difficulty swallowing.  Hx of ?warts trimmed on vocal cords in distant past.  No lightheadedness no syncope. She does have pressure ulcers on sacrum from her 3 days on the floor.  No chest pain.  She is incontinent of stool.    She does tell me she has had aortic problems since 25 years and was monitored closely with her pregnancy.  No problems occurred though. No recent follow up.     Past Medical History  Diagnosis Date  . Heart murmur   . Anemia     Past Surgical History  Procedure Laterality Date  . Cholecystectomy    . Oophorectomy      rt and lt      Current Outpatient Prescriptions  Medication Sig Dispense Refill  . albuterol (PROVENTIL) (2.5 MG/3ML) 0.083% nebulizer solution Take 3 mLs (2.5 mg total) by nebulization every 6 (six) hours as needed for wheezing or shortness of breath. 75 mL 12  . aspirin EC 325 MG tablet Take 325 mg by mouth daily as needed for moderate pain.    Marland Kitchen. azithromycin (ZITHROMAX) 250 MG tablet Take 1 tablet (250 mg total) by mouth daily. For 3 more days 3 each 0  . Ca Phosphate-Cholecalciferol (CALTRATE GUMMY BITES) 250-400 MG-UNIT CHEW Chew 2 each by mouth daily.    . cefpodoxime (VANTIN) 100 MG tablet Take 1 tablet (100 mg total) by mouth 2 (two) times daily. For 3 more days    .  Menthol, Topical Analgesic, (BENGAY EX) Apply 1 application topically daily as needed (pain.).    Marland Kitchen Multiple Vitamins-Minerals (MULTIVITAMIN WITH IRON-MINERALS) liquid Take 15 mLs by mouth daily.    . OXYGEN Inhale 3 L into the lungs continuous.    . furosemide (LASIX) 20 MG tablet Take 1 tablet (20 mg total) by mouth daily.     No current facility-administered medications for this visit.    Allergies:   Review of patient's allergies indicates no known allergies.    Social History:   The patient  reports that she has never smoked. She does not have any smokeless tobacco history on file. She reports that she does not drink alcohol.   Family History:  The patient's family history includes Cancer in her father; Heart disease in her maternal grandmother and mother.    ROS:  General:no colds or fevers, no weight changes Skin:no rashes + sacaral ulcers HEENT:no blurred vision, no congestion, complains of increased hearing problems and visual problems since fall.  CV:see HPI PUL:see HPI GI:no diarrhea constipation or melena, no indigestion problems swallowing, did have warts trimmed on vocal cords several years ago.  GU:no hematuria, no dysuria MS:no joint pain, no claudication Neuro:no syncope, no lightheadedness, was aware she was falling when she did fall.  Endo:no diabetes, no thyroid disease  Wt Readings from Last 3 Encounters:  03/10/16 105 lb 14.4 oz (48.036 kg)  03/04/16 105 lb 9.6 oz (47.9 kg)     PHYSICAL EXAM: VS:  BP 112/60 mmHg  Pulse 64  Ht  (1.676 m)  SpO2 90% , BMI There is no weight on file to calculate BMI. General:Pleasant affect, NAD Skin:Warm and dry, brisk capillary refill HEENT:normocephalic, sclera clear, mucus membranes moist, hard of hearing  Neck:supple, no JVD, no bruits  Heart:S1S2 RRR with 3-4 /6 systolic murmur hear to back.  no gallup, rub or click Lungs:diminished without rales, rhonchi, or wheezes YNW:GNFA, non tender, + BS, do not palpate liver spleen or masses Ext:1-2+ lower ext edema, 2+ pedal pulses, 2+ radial pulses Neuro:alert and oriented X 3, MAE, follows commands, + facial symmetry    EKG:  EKG is ordered today. The ekg ordered today demonstrates SR at 60 Qtc has improved, no acute ST changes.    Recent Labs: 03/04/2016: ALT 29; Magnesium 1.9; TSH 4.759* 03/05/2016: B Natriuretic Peptide 895.8*; BUN 25*; Creatinine, Ser 0.71; Hemoglobin 8.5*; Platelets 290; Potassium 4.2; Sodium 134*    Lipid Panel No results  found for: CHOL, TRIG, HDL, CHOLHDL, VLDL, LDLCALC, LDLDIRECT     Other studies Reviewed: Additional studies/ records that were reviewed today include: Echo see above.   ASSESSMENT AND PLAN:  1.  Critical Aortic Stenosis. Trileaflet; severely thickened, severely calcified  leaflets. Valve mobility was restricted. There was critical  stenosis. Peak gradient (S): 133 mm Hg. Valve area (VTI): 0.47  cm^2. Valve area (Vmax): 0.53 cm^2. Valve area (Vmean): 0.51  cm^2.  Dr. Ladona Ridgel saw the pt as well.  She has significant weakness, trouble swallowing, poor dentition, sacral ulcers.  She stated she was stable prior to fall.  Dr. Ladona Ridgel did not feel she was candidate for OHS for valve replacement.  Nor a candidate for TAVR.  We are adding lasix to help with edema and her SOB.  Discussed as valve would get worse her SOB would increase.  I will call her daughter at pt's request to let her know we will treat her medically.  She did not wish daughter to know that this  could increase.       2. Severe Pulmonary hypertension. Pulmonary arteries: Systolic pressure was severely increased. PA  peak pressure: 57 mm Hg (S).  3. Anemia last H/H 8.5/25.4  [facility]. She will follow up with Dr.Taylor in 3 months.   Current medicines are reviewed with the patient today.  The patient Has no concerns regarding medicines.  The following changes have been made:  See above Labs/ tests ordered today include:see above  Disposition:   FU:  see above  Signed, Nada BoozerLaura Mistina Coatney, NP  03/15/2016 10:09 PM    [facility] [address], [place], [place]  [address]/ [address] [place], [place] Phone: [phone]; Fax: [phone]  [phone]

## 2016-03-15 ENCOUNTER — Encounter: Payer: Self-pay | Admitting: Cardiology

## 2016-03-15 ENCOUNTER — Ambulatory Visit (INDEPENDENT_AMBULATORY_CARE_PROVIDER_SITE_OTHER): Payer: Medicare Other | Admitting: Cardiology

## 2016-03-15 VITALS — BP 112/60 | HR 64 | Ht 66.0 in

## 2016-03-15 DIAGNOSIS — R9431 Abnormal electrocardiogram [ECG] [EKG]: Secondary | ICD-10-CM

## 2016-03-15 DIAGNOSIS — I350 Nonrheumatic aortic (valve) stenosis: Secondary | ICD-10-CM

## 2016-03-15 DIAGNOSIS — D509 Iron deficiency anemia, unspecified: Secondary | ICD-10-CM | POA: Diagnosis not present

## 2016-03-15 DIAGNOSIS — I4581 Long QT syndrome: Secondary | ICD-10-CM | POA: Diagnosis not present

## 2016-03-15 DIAGNOSIS — I272 Other secondary pulmonary hypertension: Secondary | ICD-10-CM | POA: Diagnosis not present

## 2016-03-15 MED ORDER — FUROSEMIDE 20 MG PO TABS: 20.0000 mg | ORAL_TABLET | Freq: Every day | ORAL | Status: DC

## 2016-03-15 NOTE — Patient Instructions (Signed)
Medication Instructions:  1. START LASIX 20 MG DAILY  Labwork: BMET TO BE DONE IN 1 WEEK AT [facility] WITH THE RESULTS TO BE FAXED TO Nada BoozerLAURA INGOLD, PAC [phone]  Testing/Procedures: NONE  Follow-Up: 3 MONTHS WITH DR. Ladona RidgelAYLOR. WE WILL SEND OUT A REMINDER LETTER IN ABOUT 1 MONTH TO CALL AND MAKE APPT.   Any Other Special Instructions Will Be Listed Below (If Applicable).    If you need a refill on your cardiac medications before your next appointment, please call your pharmacy.

## 2016-03-20 ENCOUNTER — Encounter: Payer: Self-pay | Admitting: Family

## 2016-03-20 ENCOUNTER — Non-Acute Institutional Stay (SKILLED_NURSING_FACILITY): Payer: Medicare Other | Admitting: Family

## 2016-03-20 DIAGNOSIS — L609 Nail disorder, unspecified: Secondary | ICD-10-CM

## 2016-03-20 DIAGNOSIS — L602 Onychogryphosis: Secondary | ICD-10-CM

## 2016-03-20 LAB — BASIC METABOLIC PANEL
BUN: 17 mg/dL (ref 4–21)
Creatinine: 0.6 mg/dL (ref 0.5–1.1)
Glucose: 93 mg/dL
Potassium: 6 mmol/L — AB (ref 3.4–5.3)
SODIUM: 128 mmol/L — AB (ref 137–147)

## 2016-03-20 NOTE — Progress Notes (Signed)
Patient ID: Laura Sanchez, female   DOB: 1924-04-21, 82 y.o.   MRN: [id number]  Location:    [facility]   Nursing Home Room Number: 1008 Place of Service:  SNF (31) Provider: Dinah Ngetich FNP-C   Oneal Grout, MD  Patient Care Team: Oneal Grout, MD as PCP - General (Internal Medicine)  Extended Emergency Contact Information Primary Emergency Contact: Tobi Bast[place] Mobile Phone: [phone] Relation: Daughter  Code Status:  Full Code  Goals of care: Advanced Directive information Advanced Directives 03/03/2016  Does patient have an advance directive? No     Chief Complaint  Patient presents with  . Acute Visit    acute visit    HPI:  Pt is a 82 y.o. female seen today at [facility]    for an acute visit for evaluation of overgrown toenails. She is seen in her room today. She denies any acute issues. Facility Nurse reports patient's son concern about patient's swallowing states needs an X-ray of the neck. He states patient's difficulty swallowing might have resulted from a fall. Patient states swallowing without any difficulties now states had shortness of breath from recent pneumonia "that's why I was choking on my food". She is currently following up with speech Therapist.Patient observed eating her lunch this visit no coughing or throat clearing noted.     Past Medical History  Diagnosis Date  . Heart murmur   . Anemia    Past Surgical History  Procedure Laterality Date  . Cholecystectomy    . Oophorectomy      rt and lt     No Known Allergies    Medication List       This list is accurate as of: 03/20/16  2:51 PM.  Always use your most recent med list.               albuterol (2.5 MG/3ML) 0.083% nebulizer solution  Commonly known as:  PROVENTIL  Take 3 mLs (2.5 mg total) by nebulization every 6 (six) hours as needed for wheezing or shortness of breath.     Ben Gay Arthritis Formula 8-30 %  Crea  Apply to left shoulder as needed for pain     CALTRATE GUMMY BITES 250-400 MG-UNIT Chew  Generic drug:  Ca Phosphate-Cholecalciferol  Chew 2 each by mouth daily.     furosemide 20 MG tablet  Commonly known as:  LASIX  Take 1 tablet (20 mg total) by mouth daily.     nystatin cream  Commonly known as:  MYCOSTATIN  Apply 1 application topically 2 (two) times daily as needed (for fungal rash).        Review of Systems  Constitutional: Negative for fever, chills, activity change, appetite change and fatigue.  HENT: Negative for congestion, rhinorrhea, sinus pressure, sneezing and sore throat.   Eyes: Negative.   Respiratory: Negative for cough, choking, chest tightness, shortness of breath and wheezing.   Cardiovascular: Negative for chest pain, palpitations and leg swelling.  Gastrointestinal: Negative for nausea, vomiting, abdominal pain, diarrhea, constipation and abdominal distention.  Musculoskeletal: Positive for gait problem.       Overgrown toenails  Skin: Negative for color change, pallor, rash and wound.  Psychiatric/Behavioral: Negative for hallucinations, confusion, sleep disturbance and agitation. The patient is not nervous/anxious.      There is no immunization history on file for this patient. Pertinent  Health Maintenance Due  Topic Date Due  . DEXA SCAN  02/16/1991  . PNA vac Low Risk Adult (1 of 2 - PCV13) 02/16/1991  . INFLUENZA VACCINE  04/11/2016   No flowsheet data found. Functional Status Survey:    Filed Vitals:   03/20/16 1441  BP: 120/52  Pulse: 66  Temp: 97.6 F (36.4 C)  TempSrc: Oral  Resp: 20  Weight: 118 lb (53.524 kg)  SpO2: 100%   Body mass index is 19.05 kg/(m^2). Physical Exam  Constitutional: She appears well-developed and well-nourished. No distress.  HENT:  Head: Normocephalic.  Mouth/Throat: Oropharynx is clear and moist. No oropharyngeal exudate.  Eyes: Conjunctivae and EOM are normal. Pupils are equal, round, and  reactive to light. Right eye exhibits no discharge. Left eye exhibits no discharge. No scleral icterus.  Neck: Normal range of motion. No JVD present. No thyromegaly present.  Cardiovascular: Normal rate, regular rhythm, normal heart sounds and intact distal pulses.  Exam reveals no gallop and no friction rub.   No murmur heard. Pulmonary/Chest: Effort normal and breath sounds normal. No respiratory distress. She has no wheezes. She has no rales.  Oxygen via Nasal cannula in place   Abdominal: Soft. Bowel sounds are normal. She exhibits no distension. There is no tenderness. There is no rebound and no guarding.  Musculoskeletal: She exhibits no edema or tenderness.  Abnormal gait. Moves  X 4 extremities. LE strength 3/5. Overgrown toenails.    Lymphadenopathy:    She has no cervical adenopathy.  Neurological: She is alert.  Skin: Skin is warm and dry. No rash noted. No erythema. No pallor.  Psychiatric: She has a normal mood and affect.    Labs reviewed:  Recent Labs  03/04/16 0039 03/04/16 0625 03/05/16 0510 03/13/16 0729  NA 133* 133* 134* 130*  K 3.5 3.1* 4.2 5.2  CL 91* 97* 100*  --   CO2  --  28 29  --   GLUCOSE 96 118* 108*  --   BUN 25* 24* 25* 12  CREATININE 0.90 0.68 0.71 0.6  CALCIUM  --  7.9* 7.9*  --   MG  --  1.9  --   --     Recent Labs  03/04/16 0625  AST 55*  ALT 29  ALKPHOS 45  BILITOT 2.3*  PROT 5.6*  ALBUMIN 2.8*    Recent Labs  03/03/16 0030 03/04/16 0039 03/04/16 0625 03/05/16 0510  WBC 13.9*  --  12.1* 10.5  NEUTROABS 11.6*  --   --   --   HGB 9.4* 9.5* 9.7* 8.5*  HCT 27.6* 28.0* 28.7* 25.4*  MCV 92.0  --  89.7 93.4  PLT 292  --  298 290   Lab Results  Component Value Date   TSH 4.759* 03/04/2016   No results found for: HGBA1C No results found for: CHOL, HDL, LDLCALC, LDLDIRECT, TRIG, CHOLHDL  Significant Diagnostic Results in last 30 days:  Dg Chest 1 View  03/04/2016  CLINICAL DATA:  Oxygen deficiency today. EXAM: CHEST 1  VIEW COMPARISON:  Chest x-ray dated 03/03/2016. FINDINGS: Comparison to yesterday's x-ray is limited by patient rotation. Cardiomediastinal silhouette appears grossly stable in size and configuration. Dense opacity again noted at the left lung base. Ill-defined opacity at the right lung base is likely a combination of pleural effusion and consolidation. Overall, no significant change. Atherosclerotic changes again noted at the aortic arch. IMPRESSION: 1. No significant change compared to yesterday's chest x-ray. Bibasilar opacities, left greater than right, could represent pneumonia or atelectasis/airspace collapse. Right pleural effusion appears stable, small  to moderate in size. 2. Aortic atherosclerosis. Electronically Signed   By: Bary RichardStan  Maynard M.D.   On: 03/04/2016 14:33   Dg Chest 2 View  03/04/2016  CLINICAL DATA:  Status post fall, with pain at the left humerus. Initial encounter. EXAM: CHEST  2 VIEW COMPARISON:  None. FINDINGS: A moderate right-sided pleural effusion is noted, with patchy bilateral airspace opacity. This may reflect pneumonia or pulmonary edema. No pneumothorax is seen. The cardiomediastinal silhouette remains normal in size. No acute osseous abnormalities are identified. There is diffuse osteopenia of visualized osseous structures. Clips are noted within the right upper quadrant, reflecting prior cholecystectomy. IMPRESSION: Moderate right-sided pleural effusion, with patchy bilateral airspace opacity. This may reflect pneumonia or pulmonary edema. Electronically Signed   By: Roanna RaiderJeffery  Chang M.D.   On: 03/04/2016 00:35   Ct Head Wo Contrast  03/04/2016  CLINICAL DATA:  82 year old female with fall EXAM: CT HEAD WITHOUT CONTRAST CT CERVICAL SPINE WITHOUT CONTRAST TECHNIQUE: Multidetector CT imaging of the head and cervical spine was performed following the standard protocol without intravenous contrast. Multiplanar CT image reconstructions of the cervical spine were also generated.  COMPARISON:  None. FINDINGS: CT HEAD FINDINGS The ventricles and sulci are mildly prominent compatible with mild age-related atrophy. Mild moderate periventricular and deep white matter chronic microvascular ischemic changes noted. There is no acute intracranial hemorrhage. No mass effect or midline shift noted. The visualized paranasal sinuses and mastoid air cells are clear. The calvarium is intact. CT CERVICAL SPINE FINDINGS There is no acute fracture or subluxation of the cervical spine.There is osteopenia with multilevel degenerative changes of the spine.The odontoid and spinous processes are intact.There is normal anatomic alignment of the C1-C2 lateral masses. The visualized soft tissues appear unremarkable. Partially visualized right and probable left pleural effusions. There is biapical scarring with increased interstitial septal prominence likely a degree of edema. IMPRESSION: No acute intracranial hemorrhage. No acute/ traumatic cervical spine pathology. Electronically Signed   By: Elgie CollardArash  Radparvar M.D.   On: 03/04/2016 00:50   Ct Cervical Spine Wo Contrast  03/04/2016  CLINICAL DATA:  82 year old female with fall EXAM: CT HEAD WITHOUT CONTRAST CT CERVICAL SPINE WITHOUT CONTRAST TECHNIQUE: Multidetector CT imaging of the head and cervical spine was performed following the standard protocol without intravenous contrast. Multiplanar CT image reconstructions of the cervical spine were also generated. COMPARISON:  None. FINDINGS: CT HEAD FINDINGS The ventricles and sulci are mildly prominent compatible with mild age-related atrophy. Mild moderate periventricular and deep white matter chronic microvascular ischemic changes noted. There is no acute intracranial hemorrhage. No mass effect or midline shift noted. The visualized paranasal sinuses and mastoid air cells are clear. The calvarium is intact. CT CERVICAL SPINE FINDINGS There is no acute fracture or subluxation of the cervical spine.There is  osteopenia with multilevel degenerative changes of the spine.The odontoid and spinous processes are intact.There is normal anatomic alignment of the C1-C2 lateral masses. The visualized soft tissues appear unremarkable. Partially visualized right and probable left pleural effusions. There is biapical scarring with increased interstitial septal prominence likely a degree of edema. IMPRESSION: No acute intracranial hemorrhage. No acute/ traumatic cervical spine pathology. Electronically Signed   By: Elgie CollardArash  Radparvar M.D.   On: 03/04/2016 00:50   Dg Humerus Left  03/04/2016  CLINICAL DATA:  82 year old female with fall and left upper extremity pain. EXAM: LEFT HUMERUS - 2+ VIEW COMPARISON:  None. FINDINGS: No definite acute fracture or dislocation identified. There is advanced osteopenia which limits evaluation for fracture.  There is degenerative changes of the shoulder. The soft tissues appear unremarkable. No radiopaque foreign object identified. IMPRESSION: No acute fracture or dislocation. Electronically Signed   By: Elgie Collard M.D.   On: 03/04/2016 00:36   Dg Swallowing Func-speech Pathology  03/07/2016  Objective Swallowing Evaluation: Type of Study: MBS-Modified Barium Swallow Study Patient Details Name: Makaylen Thieme MRN: [id number] Date of Birth: 1924/05/18 Today's Date: 03/07/2016 Time: SLP Start Time (ACUTE ONLY): 1218-SLP Stop Time (ACUTE ONLY): 1250 SLP Time Calculation (min) (ACUTE ONLY): 32 min Past Medical History: No past medical history on file. Past Surgical History: No past surgical history on file. HPI: 82 yo female adm to [facility] after being found down in her home - was down for 3 days.  Pt diagnosed with rhabdomylosis.  Pt admits to h/o dysphagia, stating she frequently chokes on liquids and sensing food lodging in throat - pointing to right side.   Pt states she choked terribly on calcium  a few years ago and she was worried she would "choke to death".  Progressive dysphagia x 3 months  reported.  Swallow eval ordered.  Pt also admits to having throat surgery done many years ago for nodules.   Subjective: pt alert Assessment / Plan / Recommendation CHL IP CLINICAL IMPRESSIONS 03/07/2016 Therapy Diagnosis Moderate oral phase dysphagia;Moderate pharyngeal phase dysphagia;Suspected primary esophageal dysphagia Clinical Impression MBS completed, full report to follow.  Pt with moderate oropharyngeal and suspected primary esophageal deficits.  No aspiration noted but decreased oral control/weakness results in delayed oral transiting.  Decreased tongue base retraction and weak pharyngeal contraction results in pt having pharyngeal residuals without consistent awareness.  Pt did "hock" and expectorate x2 during testing, effectively clearing solid food vallecular residuals that she did no clear with liquid swallows.  Following solids with liquids faciliates clearance with approximately 75% of opportunities.  Chin down posture not effective.  Using live monitor, pt educated to findings and compensation strategies reinforced. Upon esophageal sweep early in study, pt appeared with decreased clearance, ? Tortuous esophagus - without awareness. Liquid swallows definitely facilitate esophageal clearance of solids. Pt took barium tablet with pudding and it readily transited into esophagus. Pt however reported issue with sensing she was "choking" on tablet, barium tablet appeared lodged in esophagus near aortic arch at that time. Further boluses of liquid, pudding, warm water did not clear it - although pt stated she was "thankful it went down". Suspect multifactorial dysphagia with contribution of esophageal issues - ? Consistent with esophageal dysmotility with exacerbation from pt positioning/kyphosis. Recommend continue soft/thin diet with pills crushed and strict precautions.Follow solids with liquids Hock" and expectorate to clear vallecular residuals. small frequent meals.  Follow up SLP at SNF  recommended to further help pt mitigate dysphagia. Thanks for this referral.   Impact on safety and function Moderate aspiration risk   CHL IP TREATMENT RECOMMENDATION 03/07/2016 Treatment Recommendations Therapy as outlined in treatment plan below   Prognosis 03/07/2016 Prognosis for Safe Diet Advancement Fair Barriers to Reach Goals Severity of deficits;Cognitive deficits Barriers/Prognosis Comment -- CHL IP DIET RECOMMENDATION 03/07/2016 SLP Diet Recommendations Dysphagia 3 (Mech soft) solids;Thin liquid Liquid Administration via Cup;Straw Medication Administration Crushed with puree Compensations Slow rate;Small sips/bites;Follow solids with liquid Postural Changes Seated upright at 90 degrees;Remain semi-upright after after feeds/meals (Comment)   CHL IP OTHER RECOMMENDATIONS 03/07/2016 Recommended Consults -- Oral Care Recommendations Oral care BID Other Recommendations --   CHL IP FOLLOW UP RECOMMENDATIONS 03/06/2016 Follow up Recommendations Skilled Nursing facility   [facility]  IP FREQUENCY AND DURATION 03/07/2016 Speech Therapy Frequency (ACUTE ONLY) min 1 x/week Treatment Duration 1 week      CHL IP ORAL PHASE 03/07/2016 Oral Phase Impaired Oral - Pudding Teaspoon -- Oral - Pudding Cup -- Oral - Honey Teaspoon -- Oral - Honey Cup -- Oral - Nectar Teaspoon Weak lingual manipulation Oral - Nectar Cup Weak lingual manipulation;Delayed oral transit;Reduced posterior propulsion Oral - Nectar Straw -- Oral - Thin Teaspoon Weak lingual manipulation;Delayed oral transit;Reduced posterior propulsion Oral - Thin Cup Weak lingual manipulation;Delayed oral transit;Reduced posterior propulsion Oral - Thin Straw Weak lingual manipulation;Delayed oral transit;Reduced posterior propulsion Oral - Puree Weak lingual manipulation;Delayed oral transit;Reduced posterior propulsion Oral - Mech Soft -- Oral - Regular Weak lingual manipulation;Delayed oral transit;Reduced posterior propulsion Oral - Multi-Consistency -- Oral - Pill Weak  lingual manipulation;Delayed oral transit Oral Phase - Comment --  CHL IP PHARYNGEAL PHASE 03/07/2016 Pharyngeal Phase Impaired Pharyngeal- Pudding Teaspoon -- Pharyngeal -- Pharyngeal- Pudding Cup -- Pharyngeal -- Pharyngeal- Honey Teaspoon -- Pharyngeal -- Pharyngeal- Honey Cup -- Pharyngeal -- Pharyngeal- Nectar Teaspoon Reduced tongue base retraction;Pharyngeal residue - valleculae Pharyngeal -- Pharyngeal- Nectar Cup Reduced tongue base retraction;Pharyngeal residue - valleculae Pharyngeal -- Pharyngeal- Nectar Straw -- Pharyngeal -- Pharyngeal- Thin Teaspoon Reduced tongue base retraction;Pharyngeal residue - pyriform;Pharyngeal residue - valleculae Pharyngeal -- Pharyngeal- Thin Cup Reduced tongue base retraction;Pharyngeal residue - valleculae;Pharyngeal residue - pyriform Pharyngeal -- Pharyngeal- Thin Straw Reduced tongue base retraction;Pharyngeal residue - pyriform;Pharyngeal residue - valleculae Pharyngeal -- Pharyngeal- Puree Reduced tongue base retraction Pharyngeal -- Pharyngeal- Mechanical Soft -- Pharyngeal -- Pharyngeal- Regular Reduced tongue base retraction;Pharyngeal residue - valleculae Pharyngeal -- Pharyngeal- Multi-consistency -- Pharyngeal -- Pharyngeal- Pill Reduced tongue base retraction;Pharyngeal residue - valleculae Pharyngeal -- Pharyngeal Comment --  CHL IP CERVICAL ESOPHAGEAL PHASE 03/07/2016 Cervical Esophageal Phase Impaired Pudding Teaspoon -- Pudding Cup -- Honey Teaspoon -- Honey Cup -- Nectar Teaspoon -- Nectar Cup -- Nectar Straw -- Thin Teaspoon -- Thin Cup -- Thin Straw -- Puree -- Mechanical Soft -- Regular -- Multi-consistency -- Pill -- Cervical Esophageal Comment appearance of prominent cricopharyngeus Donavan Burnet, MS [facility] SLP [phone]              Dg Hip Unilat With Pelvis 2-3 Views Left  03/04/2016  CLINICAL DATA:  Status post fall, with left hip pain. Initial encounter. EXAM: DG HIP (WITH OR WITHOUT PELVIS) 2-3V LEFT COMPARISON:  None. FINDINGS: There is no  evidence of fracture or dislocation. Both femoral heads are seated normally within their respective acetabula. The proximal left femur appears intact. No significant degenerative change is appreciated. The sacroiliac joints are unremarkable in appearance. The visualized bowel gas pattern is grossly unremarkable in appearance. IMPRESSION: No evidence of fracture or dislocation. Electronically Signed   By: Roanna Raider M.D.   On: 03/04/2016 00:36    Assessment/Plan Overgrown Toenail Refer to Podiatrist ASAP to trim toenail.   Dysphagia  No coughing or throat clearing while eating. Shortness of breath resolved from recent pneumonia. Continue to follow up with Speech therapist. Aspiration Precautions.     Family/ staff Communication: Reviewed plan of care with patient and facility Nurse supervisor.  Labs/tests ordered:  None

## 2016-03-22 LAB — BASIC METABOLIC PANEL
BUN: 18 mg/dL (ref 4–21)
Creatinine: 0.6 mg/dL (ref 0.5–1.1)
GLUCOSE: 91 mg/dL
POTASSIUM: 4.9 mmol/L (ref 3.4–5.3)
Sodium: 130 mmol/L — AB (ref 137–147)

## 2016-03-28 LAB — CBC AND DIFFERENTIAL
HCT: 31 % — AB (ref 36–46)
Hemoglobin: 10.1 g/dL — AB (ref 12.0–16.0)
PLATELETS: 398 10*3/uL (ref 150–399)
WBC: 6 10*3/mL

## 2016-03-28 LAB — BASIC METABOLIC PANEL
BUN: 19 mg/dL (ref 4–21)
CREATININE: 0.5 mg/dL (ref 0.5–1.1)
Glucose: 92 mg/dL
POTASSIUM: 4.9 mmol/L (ref 3.4–5.3)
SODIUM: 128 mmol/L — AB (ref 137–147)

## 2016-03-31 LAB — TSH: TSH: 4.38 u[IU]/mL (ref 0.41–5.90)

## 2016-05-05 ENCOUNTER — Non-Acute Institutional Stay (SKILLED_NURSING_FACILITY): Payer: Medicare Other | Admitting: Family

## 2016-05-05 ENCOUNTER — Encounter: Payer: Self-pay | Admitting: Family

## 2016-05-05 DIAGNOSIS — I5032 Chronic diastolic (congestive) heart failure: Secondary | ICD-10-CM | POA: Diagnosis not present

## 2016-05-05 DIAGNOSIS — I350 Nonrheumatic aortic (valve) stenosis: Secondary | ICD-10-CM

## 2016-05-05 DIAGNOSIS — K029 Dental caries, unspecified: Secondary | ICD-10-CM

## 2016-05-05 DIAGNOSIS — D649 Anemia, unspecified: Secondary | ICD-10-CM | POA: Diagnosis not present

## 2016-05-05 DIAGNOSIS — R627 Adult failure to thrive: Secondary | ICD-10-CM

## 2016-05-05 NOTE — Progress Notes (Signed)
Location:   [facility] Room Number: 1005 Place of Service:  SNF (31) Provider:  Richarda Blade, FNP-C  Oneal Grout, MD  Patient Care Team: Oneal Grout, MD as PCP - General (Internal Medicine)  Extended Emergency Contact Information Primary Emergency Contact: Tobi Bastos [place] Mobile Phone: [phone] Relation: Daughter  Code Status:  Full Code Goals of care: Advanced Directive information Advanced Directives 05/05/2016  Does patient have an advance directive? (No Data)  Would patient like information on creating an advanced directive? (No Data)     Chief Complaint  Patient presents with  . Medical Management of Chronic Issues    routine    HPI:  Pt is a 82 y.o. female seen today at [facility] for medical management of chronic diseases. She is seen in her room today. She denies any acute issues. She was seen by [facility] Op[facility] 04/13/2016 dense cataracts noted without any evidence of tears though had difficult view. Recommended reevaluation if patient proceeds with surgery plans. Patient to follow up in 6 months ( 10/10/2016) with Dr. Burgess Estelle. Facility staff reports no new concerns. She was also seen by Podiatrist 03/20/2016 overgrown toenails trimmed. Previous left ankle pressure ulcer healed has occlusive drsg to prevent friction. She has had a 18 pound weight loss over two months. She continues to follow up with Registered Dietician. Cardiac diet discontinued now on pureed mechanical soft with snacks. No recent fall episodes or hospital admission reported.    Past Medical History:  Diagnosis Date  . Anemia   . Elevated troponin 03/04/2016  . Heart murmur   . History of falling   . Pneumonia 03/03/2016  . Pressure ulcer 03/04/2016  . Prolonged Q-T interval on ECG 03/04/2016  . Rhabdomyolysis 03/04/2016   Past Surgical History:  Procedure Laterality Date  . CHOLECYSTECTOMY    . OOPHORECTOMY      rt and lt       Medication List       Accurate as of 05/05/16  2:47 PM. Always use your most recent med list.          albuterol (2.5 MG/3ML) 0.083% nebulizer solution Commonly known as:  PROVENTIL Take 3 mLs (2.5 mg total) by nebulization every 6 (six) hours as needed for wheezing or shortness of breath.   Ben Gay Arthritis Formula 8-30 % Crea Apply to left shoulder as needed for pain   CALTRATE GUMMY BITES 250-400 MG-UNIT Chew Generic drug:  Ca Phosphate-Cholecalciferol Chew 2 each by mouth daily.   CEROVITE ADVANCED FORMULA Liqd Take by mouth. Take 15 ml by mouth daily with food   furosemide 20 MG tablet Commonly known as:  LASIX Take 1 tablet (20 mg total) by mouth daily.       Review of Systems  Constitutional: Negative for activity change, appetite change, chills, fatigue and fever.  HENT: Negative for congestion, rhinorrhea, sinus pressure, sneezing and sore throat.   Eyes: Negative.   Respiratory: Negative for cough, choking, chest tightness, shortness of breath and wheezing.   Cardiovascular: Negative for chest pain, palpitations and leg swelling.  Gastrointestinal: Negative for abdominal distention, abdominal pain, constipation, diarrhea, nausea and vomiting.  Endocrine: Negative.   Genitourinary: Negative for dysuria, flank pain and urgency.  Musculoskeletal: Positive for gait problem.  Skin: Negative for color change, pallor, rash and wound.  Neurological: Negative for dizziness, seizures, syncope, light-headedness and headaches.  Psychiatric/Behavioral: Negative for agitation, confusion, hallucinations and sleep disturbance.  The patient is not nervous/anxious.      There is no immunization history on file for this patient. Pertinent  Health Maintenance Due  Topic Date Due  . DEXA SCAN  02/16/1991  . PNA vac Low Risk Adult (1 of 2 - PCV13) 02/16/1991  . INFLUENZA VACCINE  04/11/2016      Vitals:   05/05/16 1428  BP: 127/60  Pulse: 70  Resp:  20  Temp: 99 F (37.2 C)  SpO2: 99%  Weight: 87 lb 6.4 oz (39.6 kg)  Height: 5\' 6"  (1.676 m)   Body mass index is 14.11 kg/m. Physical Exam  Constitutional: She appears well-developed and well-nourished. No distress.  Frail pleasant Elderly in no acute distress  HENT:  Head: Normocephalic.  Mouth/Throat: Oropharynx is clear and moist. No oropharyngeal exudate.  Multiple lower and upper black broken teeth with cavities.    Eyes: Conjunctivae and EOM are normal. Pupils are equal, round, and reactive to light. Right eye exhibits no discharge. Left eye exhibits no discharge. No scleral icterus.  Neck: Normal range of motion. No JVD present. No thyromegaly present.  Cardiovascular: Normal rate, regular rhythm, normal heart sounds and intact distal pulses.  Exam reveals no gallop and no friction rub.   No murmur heard. Pulmonary/Chest: Effort normal and breath sounds normal. No respiratory distress. She has no wheezes. She has no rales.  Oxygen 2 liters via Nasal cannula in place   Abdominal: Soft. Bowel sounds are normal. She exhibits no distension. There is no tenderness. There is no rebound and no guarding.  Genitourinary:  Genitourinary Comments: Incontinent   Musculoskeletal: She exhibits no edema or tenderness.  Abnormal gait. Moves  X 4 extremities. LE strength 3/5.    Lymphadenopathy:    She has no cervical adenopathy.  Neurological: She is alert.  Skin: Skin is warm and dry. No rash noted. No erythema. No pallor.  Left ankle previous ulcer site red scab noted.    Psychiatric: She has a normal mood and affect.    Labs reviewed:  Recent Labs  03/04/16 0039 03/04/16 0625 03/05/16 0510 03/13/16 0729  NA 133* 133* 134* 130*  K 3.5 3.1* 4.2 5.2  CL 91* 97* 100*  --   CO2  --  28 29  --   GLUCOSE 96 118* 108*  --   BUN 25* 24* 25* 12  CREATININE 0.90 0.68 0.71 0.6  CALCIUM  --  7.9* 7.9*  --   MG  --  1.9  --   --     Recent Labs  03/04/16 0625  AST 55*  ALT 29    ALKPHOS 45  BILITOT 2.3*  PROT 5.6*  ALBUMIN 2.8*    Recent Labs  03/03/16 0030 03/04/16 0039 03/04/16 0625 03/05/16 0510  WBC 13.9*  --  12.1* 10.5  NEUTROABS 11.6*  --   --   --   HGB 9.4* 9.5* 9.7* 8.5*  HCT 27.6* 28.0* 28.7* 25.4*  MCV 92.0  --  89.7 93.4  PLT 292  --  298 290   Lab Results  Component Value Date   TSH 4.759 (H) 03/04/2016   Assessment/Plan Dental cavities  Multiple cavities and broke teeth. Request to see dentist. Refer to dental for evaluation of dental cavities and broken teeth.   Severe Aortic Stenosis  Chronic. Continue to monitor. Continue to follow up with cardiology.   Failure to thrive  Has had 18 pounds weight loss over two months. Continue to follow up with  RD. Continue protein supplements and Pureed mechanical soft diet with snacks. Continue on MVI.   CHF Stable. Continue on Furosemide and continuous oxygen 2 liters. Wean off as tolerated.   Anemia  Continue on monitor CBC.    Family/ staff Communication: Reviewed plan of care with patient and facility Nurse supervisor.   Labs/tests ordered:  None

## 2016-06-05 ENCOUNTER — Encounter: Payer: Self-pay | Admitting: Family

## 2016-06-05 ENCOUNTER — Non-Acute Institutional Stay (SKILLED_NURSING_FACILITY): Payer: Medicare Other | Admitting: Family

## 2016-06-05 DIAGNOSIS — I350 Nonrheumatic aortic (valve) stenosis: Secondary | ICD-10-CM

## 2016-06-05 DIAGNOSIS — I5032 Chronic diastolic (congestive) heart failure: Secondary | ICD-10-CM | POA: Diagnosis not present

## 2016-06-05 DIAGNOSIS — R1310 Dysphagia, unspecified: Secondary | ICD-10-CM

## 2016-06-05 DIAGNOSIS — D638 Anemia in other chronic diseases classified elsewhere: Secondary | ICD-10-CM | POA: Diagnosis not present

## 2016-06-05 DIAGNOSIS — E46 Unspecified protein-calorie malnutrition: Secondary | ICD-10-CM | POA: Diagnosis not present

## 2016-06-05 NOTE — Progress Notes (Signed)
Location:  [facility] Room Number: 1005A Place of Service:  SNF (31) Provider:  Richarda Bladeinah Quiara Killian, FNP-C  Oneal GroutPANDEY, MAHIMA, MD  Patient Care Team: Oneal GroutMahima Pandey, MD as PCP - General (Internal Medicine)  Extended Emergency Contact Information Primary Emergency Contact: Tobi Bastosripp,Jean S  [place] Mobile Phone: [phone] Relation: Daughter  Code Status:  Full code Goals of care: Advanced Directive information Advanced Directives 06/05/2016  Does patient have an advance directive? No  Would patient like information on creating an advanced directive? -     Chief Complaint  Patient presents with  . Medical Management of Chronic Issues    routine    HPI:  Pt is a 82 y.o. female seen today at [facility] for medical management of chronic diseases.She has a medical history of Aortic Stenosis, CHF, Anemia, Dysphagia among other conditions. She is seen in her room today. She denies any acute issues this visit. She has had no skin breakdown, no recent fall episodes or hospital admission since previous visit. Facility staff reports no new issues.    Past Medical History:  Diagnosis Date  . Anemia   . Elevated troponin 03/04/2016  . Heart murmur   . History of falling   . Pneumonia 03/03/2016  . Pressure ulcer 03/04/2016  . Prolonged Q-T interval on ECG 03/04/2016  . Rhabdomyolysis 03/04/2016   Past Surgical History:  Procedure Laterality Date  . CHOLECYSTECTOMY    . OOPHORECTOMY     rt and lt     No Known Allergies    Medication List       Accurate as of 06/05/16  2:52 PM. Always use your most recent med list.          albuterol (2.5 MG/3ML) 0.083% nebulizer solution Commonly known as:  PROVENTIL Take 3 mLs (2.5 mg total) by nebulization every 6 (six) hours as needed for wheezing or shortness of breath.   Ben Gay Arthritis Formula 8-30 % Crea Apply to left shoulder as needed for pain   CALTRATE GUMMY BITES  250-400 MG-UNIT Chew Generic drug:  Ca Phosphate-Cholecalciferol Chew 2 each by mouth daily.   CEROVITE ADVANCED FORMULA Liqd Take by mouth. Take 15 ml by mouth daily with food   furosemide 20 MG tablet Commonly known as:  LASIX Take 1 tablet (20 mg total) by mouth daily.   NUTRITIONAL DRINK PO Take by mouth. With lunch and dinner for increased sensory integration and improved  Po intake       Review of Systems  Constitutional: Negative for activity change, appetite change, chills, fatigue and fever.  HENT: Negative for congestion, rhinorrhea, sinus pressure, sneezing and sore throat.   Eyes: Negative.   Respiratory: Negative for cough, choking, chest tightness, shortness of breath and wheezing.   Cardiovascular: Negative for chest pain, palpitations and leg swelling.  Gastrointestinal: Negative for abdominal distention, abdominal pain, constipation, diarrhea, nausea and vomiting.  Endocrine: Negative.   Genitourinary: Negative for dysuria, flank pain and urgency.  Musculoskeletal: Positive for gait problem.  Skin: Negative for color change, pallor, rash and wound.  Neurological: Negative for dizziness, seizures, syncope, light-headedness and headaches.  Psychiatric/Behavioral: Negative for agitation, confusion, hallucinations and sleep disturbance. The patient is not nervous/anxious.      There is no immunization history on file for this patient. Pertinent  Health Maintenance Due  Topic Date Due  . DEXA SCAN  02/16/1991  . PNA vac Low Risk Adult (1 of 2 -  PCV13) 02/16/1991  . INFLUENZA VACCINE  04/11/2016      Vitals:   06/05/16 1441  BP: 123/70  Pulse: 68  Resp: 18  Temp: 98 F (36.7 C)  SpO2: 97%  Weight: 93 lb 6.4 oz (42.4 kg)  Height: 5\' 6"  (1.676 m)   Body mass index is 15.08 kg/m. Physical Exam  Constitutional: She appears well-developed and well-nourished. No distress.  Frail pleasant Elderly in no acute distress  HENT:  Head: Normocephalic.    Mouth/Throat: Oropharynx is clear and moist. No oropharyngeal exudate.  Eyes: Conjunctivae and EOM are normal. Pupils are equal, round, and reactive to light. Right eye exhibits no discharge. Left eye exhibits no discharge. No scleral icterus.  Neck: Normal range of motion. No JVD present. No thyromegaly present.  Cardiovascular: Intact distal pulses.  Exam reveals no gallop and no friction rub.   Aortic Stenosis   Pulmonary/Chest: Effort normal and breath sounds normal. No respiratory distress. She has no wheezes. She has no rales.  Oxygen 2 liters via Nasal cannula in place   Abdominal: Soft. Bowel sounds are normal. She exhibits no distension. There is no tenderness. There is no rebound and no guarding.  Genitourinary:  Genitourinary Comments: Incontinent for both B/B   Musculoskeletal: She exhibits no edema or tenderness.  Abnormal gait. Moves X 4 extremities. LE strength 3/5.    Lymphadenopathy:    She has no cervical adenopathy.  Neurological: She is alert.  Skin: Skin is warm and dry. No rash noted. No erythema. No pallor.  Psychiatric: She has a normal mood and affect.    Labs reviewed:  Recent Labs  03/04/16 0039 03/04/16 0625 03/05/16 0510 03/13/16 0729  NA 133* 133* 134* 130*  K 3.5 3.1* 4.2 5.2  CL 91* 97* 100*  --   CO2  --  28 29  --   GLUCOSE 96 118* 108*  --   BUN 25* 24* 25* 12  CREATININE 0.90 0.68 0.71 0.6  CALCIUM  --  7.9* 7.9*  --   MG  --  1.9  --   --     Recent Labs  03/04/16 0625  AST 55*  ALT 29  ALKPHOS 45  BILITOT 2.3*  PROT 5.6*  ALBUMIN 2.8*    Recent Labs  03/03/16 0030 03/04/16 0039 03/04/16 0625 03/05/16 0510  WBC 13.9*  --  12.1* 10.5  NEUTROABS 11.6*  --   --   --   HGB 9.4* 9.5* 9.7* 8.5*  HCT 27.6* 28.0* 28.7* 25.4*  MCV 92.0  --  89.7 93.4  PLT 292  --  298 290   Lab Results  Component Value Date   TSH 4.38 03/31/2016   Assessment/Plan  Chronic diastolic CHF Stable. Continue on furosemide. Continue oxygen 2  Liters Nasal cannula.Monitor weight  Dysphagia Continue on pureed diet. Aspiration precautions.   Protein calorie malnutrition Continue Protein supplements.Continue multivitamin. Monitor BMP   Anemia of chronic disease Continue MVI Monitor CBC  Aortic Stenosis  Chronic. Continue to monitor.    Family/ staff Communication: Reviewed plan with patient and facility Nurse supervisor.   Labs/tests ordered:  None

## 2016-06-14 ENCOUNTER — Ambulatory Visit: Payer: Medicare Other | Admitting: Internal Medicine

## 2016-06-15 ENCOUNTER — Encounter: Payer: Self-pay | Admitting: Internal Medicine

## 2016-06-16 ENCOUNTER — Non-Acute Institutional Stay (SKILLED_NURSING_FACILITY): Payer: Medicare Other | Admitting: Family

## 2016-06-16 DIAGNOSIS — R059 Cough, unspecified: Secondary | ICD-10-CM

## 2016-06-16 DIAGNOSIS — R05 Cough: Secondary | ICD-10-CM | POA: Diagnosis not present

## 2016-06-16 NOTE — Progress Notes (Signed)
Location:  [facility] Room Number: 1005 A Place of Service:  SNF (31) Provider:  Dinah Ngetich FNP-C   Oneal Grout, MD  Patient Care Team: Oneal Grout, MD as PCP - General (Internal Medicine)  Extended Emergency Contact Information Primary Emergency Contact: Tobi Bastos [place] Mobile Phone: [phone] Relation: Daughter  Code Status: Full Code  Goals of care: Advanced Directive information Advanced Directives 06/05/2016  Does patient have an advance directive? No  Would patient like information on creating an advanced directive? -     Chief Complaint  Patient presents with  . Acute Visit    cough     HPI:  Pt is a 82 y.o. female seen today for an acute visit for evaluation of cough. She has  A significant medical history of  Congestive heart failure among other conditions.She is seen in her room today. She complains of non-productive cough for the past several days. She denies any fever, chills or chest pain. Facility Nurse reports no recent abrupt weight gain.     Past Medical History:  Diagnosis Date  . Anemia   . Elevated troponin 03/04/2016  . Heart murmur   . History of falling   . Pneumonia 03/03/2016  . Pressure ulcer 03/04/2016  . Prolonged Q-T interval on ECG 03/04/2016  . Rhabdomyolysis 03/04/2016   Past Surgical History:  Procedure Laterality Date  . CHOLECYSTECTOMY    . OOPHORECTOMY     rt and lt     No Known Allergies    Medication List       Accurate as of 06/16/16  3:24 PM. Always use your most recent med list.          albuterol (2.5 MG/3ML) 0.083% nebulizer solution Commonly known as:  PROVENTIL Take 3 mLs (2.5 mg total) by nebulization every 6 (six) hours as needed for wheezing or shortness of breath.   Ben Gay Arthritis Formula 8-30 % Crea Apply to left shoulder as needed for pain   CALTRATE GUMMY BITES 250-400 MG-UNIT Chew Generic drug:  Ca Phosphate-Cholecalciferol Chew  2 each by mouth daily.   CEROVITE ADVANCED FORMULA Liqd Take by mouth. Take 15 ml by mouth daily with food   furosemide 20 MG tablet Commonly known as:  LASIX Take 1 tablet (20 mg total) by mouth daily.   NUTRITIONAL DRINK PO Take by mouth. With lunch and dinner for increased sensory integration and improved  Po intake       Review of Systems  Constitutional: Negative for activity change, appetite change, chills, fatigue and fever.  HENT: Negative for congestion, rhinorrhea, sinus pressure, sneezing and sore throat.   Eyes: Negative.   Respiratory: Positive for cough. Negative for choking, chest tightness, shortness of breath and wheezing.   Cardiovascular: Negative for chest pain, palpitations and leg swelling.  Gastrointestinal: Negative for abdominal distention, abdominal pain, constipation, diarrhea, nausea and vomiting.  Genitourinary: Negative for dysuria, flank pain and urgency.  Musculoskeletal: Positive for gait problem.  Skin: Negative for color change, pallor, rash and wound.  Neurological: Negative for dizziness, seizures, syncope, light-headedness and headaches.  Psychiatric/Behavioral: Negative for agitation, confusion, hallucinations and sleep disturbance. The patient is not nervous/anxious.      There is no immunization history on file for this patient. Pertinent  Health Maintenance Due  Topic Date Due  . DEXA SCAN  02/16/1991  . PNA vac Low Risk Adult (1 of 2 - PCV13) 02/16/1991  . INFLUENZA VACCINE  04/11/2016       Vitals:   06/16/16 1516  BP: 132/66  Resp: 18  SpO2: 94%  Weight: 96 lb 11.2 oz (43.9 kg)  Height: 5\' 6"  (1.676 m)   Body mass index is 15.61 kg/m. Physical Exam  Constitutional: She appears well-developed and well-nourished. No distress.  Frail pleasant Elderly in no acute distress  HENT:  Head: Normocephalic.  Mouth/Throat: Oropharynx is clear and moist. No oropharyngeal exudate.  Eyes: Conjunctivae and EOM are normal. Pupils are  equal, round, and reactive to light. Right eye exhibits no discharge. Left eye exhibits no discharge. No scleral icterus.  Neck: Normal range of motion. No JVD present. No thyromegaly present.  Cardiovascular: Intact distal pulses.  Exam reveals no gallop and no friction rub.   Aortic Stenosis   Pulmonary/Chest: Effort normal. No respiratory distress. She has no wheezes. She has no rales.  Bilat. Lung diminished to auscultation.Oxygen 2 liters via Nasal cannula in place.    Abdominal: Soft. Bowel sounds are normal. She exhibits no distension. There is no tenderness. There is no rebound and no guarding.  Genitourinary:  Genitourinary Comments: Incontinent for both B/B   Musculoskeletal: She exhibits no edema or tenderness.   Moves X 4 extremities.generalized weakness.    Lymphadenopathy:    She has no cervical adenopathy.  Neurological: She is alert.  Skin: Skin is warm and dry. No rash noted. No erythema. No pallor.  Psychiatric: She has a normal mood and affect.    Labs reviewed:  Recent Labs  03/04/16 0039 03/04/16 0625 03/05/16 0510 03/13/16 0729  NA 133* 133* 134* 130*  K 3.5 3.1* 4.2 5.2  CL 91* 97* 100*  --   CO2  --  28 29  --   GLUCOSE 96 118* 108*  --   BUN 25* 24* 25* 12  CREATININE 0.90 0.68 0.71 0.6  CALCIUM  --  7.9* 7.9*  --   MG  --  1.9  --   --     Recent Labs  03/04/16 0625  AST 55*  ALT 29  ALKPHOS 45  BILITOT 2.3*  PROT 5.6*  ALBUMIN 2.8*    Recent Labs  03/03/16 0030 03/04/16 0039 03/04/16 0625 03/05/16 0510  WBC 13.9*  --  12.1* 10.5  NEUTROABS 11.6*  --   --   --   HGB 9.4* 9.5* 9.7* 8.5*  HCT 27.6* 28.0* 28.7* 25.4*  MCV 92.0  --  89.7 93.4  PLT 292  --  298 290   Lab Results  Component Value Date   TSH 4.38 03/31/2016    Assessment/Plan Cough  Productive thick phlegm.Bilat. Dim breath sounds. Continue Robitussin PRN. Obtain Portable CXR Pa/lat rule out PNA   Family/ staff Communication: Reviewed plan of care with patient  and facility DON and Nurse supervisor.   Labs/tests ordered:  Portable CXR Pa/lat rule out PNA

## 2016-06-19 ENCOUNTER — Encounter: Payer: Self-pay | Admitting: Internal Medicine

## 2016-06-19 ENCOUNTER — Non-Acute Institutional Stay (SKILLED_NURSING_FACILITY): Payer: Medicare Other | Admitting: Internal Medicine

## 2016-06-19 DIAGNOSIS — I509 Heart failure, unspecified: Secondary | ICD-10-CM | POA: Diagnosis not present

## 2016-06-19 DIAGNOSIS — R918 Other nonspecific abnormal finding of lung field: Secondary | ICD-10-CM

## 2016-06-19 DIAGNOSIS — R0602 Shortness of breath: Secondary | ICD-10-CM

## 2016-06-19 DIAGNOSIS — I350 Nonrheumatic aortic (valve) stenosis: Secondary | ICD-10-CM

## 2016-06-19 DIAGNOSIS — F411 Generalized anxiety disorder: Secondary | ICD-10-CM

## 2016-06-19 NOTE — Progress Notes (Signed)
LOCATION: [facility]  PCP: Oneal Grout, MD   Code Status:  Full Code  Goals of care: Advanced Directive information Advanced Directives 06/05/2016  Does patient have an advance directive? No  Would patient like information on creating an advanced directive? -       Extended Emergency Contact Information Primary Emergency Contact: Tobi Bastos [place] Mobile Phone: [phone] Relation: Daughter   No Known Allergies  Chief Complaint  Patient presents with  . Acute Visit    dyspnea     HPI:  Patient is a 82 y.o. female seen today for acute visit with dyspnea. She has been having cough for 2 weeks and now has worsening dyspnea. She has hx of chf and had pneumonia in 6/17. She also has history of critical AS and moderate pulmonary HTN. She was noted to ave right lobar pneumonia and was started on po antibiotics over the weekend.  Review of Systems:  Constitutional: Negative for chills and diaphoresis. she feels weak and tired. She complaints of being feverish at noon. No documented temperature spike per nursing.  HENT: Negative for headache, congestion. Has occasional difficulty swallowing. Negative for nasal discharge. Has hearing loss. Eyes: has poor vision Respiratory: Negative for wheezing. Positive for cough with white phlegm.  Cardiovascular: Negative for chest pain, palpitations, leg swelling.  Gastrointestinal: Negative for heartburn, nausea, vomiting.  Genitourinary: Negative for dysuria Musculoskeletal: Negative for fall in the facility.  Skin: Negative for itching, rash.  Neurological: Negative for dizziness.   Past Medical History:  Diagnosis Date  . Anemia   . Elevated troponin 03/04/2016  . Heart murmur   . History of falling   . Pneumonia 03/03/2016  . Pressure ulcer 03/04/2016  . Prolonged Q-T interval on ECG 03/04/2016  . Rhabdomyolysis 03/04/2016   Past Surgical History:  Procedure Laterality Date  . CHOLECYSTECTOMY     . OOPHORECTOMY     rt and lt      Medications:   Medication List       Accurate as of 06/19/16 10:45 AM. Always use your most recent med list.          albuterol (2.5 MG/3ML) 0.083% nebulizer solution Commonly known as:  PROVENTIL Take 3 mLs (2.5 mg total) by nebulization every 6 (six) hours as needed for wheezing or shortness of breath.   Ben Gay Arthritis Formula 8-30 % Crea Apply to left shoulder as needed for pain   CALTRATE GUMMY BITES 250-400 MG-UNIT Chew Generic drug:  Ca Phosphate-Cholecalciferol Chew 2 each by mouth daily.   CEROVITE ADVANCED FORMULA Liqd Take by mouth. Take 15 ml by mouth daily with food   doxycycline 100 MG tablet Commonly known as:  ADOXA Take 100 mg by mouth 2 (two) times daily.   furosemide 40 MG tablet Commonly known as:  LASIX Take 40 mg by mouth daily.   guaifenesin 100 MG/5ML syrup Commonly known as:  ROBITUSSIN Take by mouth 3 (three) times daily. Give 10 cc   NUTRITIONAL DRINK PO Take by mouth. With lunch and dinner for increased sensory integration and improved  Po intake   potassium chloride SA 20 MEQ tablet Commonly known as:  K-DUR,KLOR-CON Take 20 mEq by mouth daily.       Immunizations: Immunization History  Administered Date(s) Administered  . PPD Test 06/16/2016     Physical Exam: Vitals:   06/19/16 1024  BP: 130/62  Pulse: 64  Resp: 20  Temp: 97.8 F (36.6 C)  TempSrc:  Oral  SpO2: 99%  Weight: 96 lb 11.2 oz (43.9 kg)  Height: 5\' 6"  (1.676 m)   Body mass index is 15.61 kg/m.  Wt Readings from Last 3 Encounters:  06/19/16 96 lb 11.2 oz (43.9 kg)  06/16/16 96 lb 11.2 oz (43.9 kg)  06/05/16 93 lb 6.4 oz (42.4 kg)   General- elderly female, frail and thin built, in no acute distress Head- normocephalic, atraumatic Nose- no maxillary or frontal sinus tenderness, no nasal discharge Throat- moist mucus membrane, poor dentition  Eyes- no pallor, no icterus Neck- no cervical  lymphadenopathy Cardiovascular- normal s1,s2, + murmur Respiratory- bilateral poor air entry, on 2l o2 by [place], no use of accessory muscles, rhonchi +, crackles +, no wheeze Abdomen- bowel sounds present, soft, non tender Musculoskeletal- able to move all 4 extremities, generalized weakness, kyphosis present Neurological- alert and oriented to person, place and time Skin- warm and dry, has floaters to both heels Psychiatry- appears anxious    Labs reviewed: Basic Metabolic Panel:  Recent Labs  16/10/96 0039 03/04/16 0625 03/05/16 0510  03/20/16 03/22/16 03/28/16  NA 133* 133* 134*  < > 128* 130* 128*  K 3.5 3.1* 4.2  < > 6.0* 4.9 4.9  CL 91* 97* 100*  --   --   --   --   CO2  --  28 29  --   --   --   --   GLUCOSE 96 118* 108*  --   --   --   --   BUN 25* 24* 25*  < > 17 18 19   CREATININE 0.90 0.68 0.71  < > 0.6 0.6 0.5  CALCIUM  --  7.9* 7.9*  --   --   --   --   MG  --  1.9  --   --   --   --   --   < > = values in this interval not displayed. Liver Function Tests:  Recent Labs  03/04/16 0625  AST 55*  ALT 29  ALKPHOS 45  BILITOT 2.3*  PROT 5.6*  ALBUMIN 2.8*   No results for input(s): LIPASE, AMYLASE in the last 8760 hours. No results for input(s): AMMONIA in the last 8760 hours. CBC:  Recent Labs  03/03/16 0030  03/04/16 0625 03/05/16 0510 03/28/16  WBC 13.9*  --  12.1* 10.5 6.0  NEUTROABS 11.6*  --   --   --   --   HGB 9.4*  < > 9.7* 8.5* 10.1*  HCT 27.6*  < > 28.7* 25.4* 31*  MCV 92.0  --  89.7 93.4  --   PLT 292  --  298 290 398  < > = values in this interval not displayed. Cardiac Enzymes:  Recent Labs  03/03/16 0030 03/04/16 0246 03/04/16 0625 03/04/16 1129 03/05/16 0510  CKTOTAL 758* 696*  --   --  247*  CKMB 13.9*  --   --   --  8.3*  TROPONINI  --   --  0.22*  0.23* 0.19*  --    BNP: Invalid input(s): POCBNP CBG: No results for input(s): GLUCAP in the last 8760 hours.  Radiological Exams:  06/16/16 chest xray- bilateral pleural  effusion with pulmonary congestion more on right side, right sided pneumonia  03/24/16 chest xray- chf with small effusion and cardiomegaly, bilateral pneumonia  03/04/16 chest xray- bibasilar opacities left > right, right pleural effusion    Assessment/Plan   Dyspnea Likely multifactorial with her chf, pneumonia, severe  AS, pulmonary HTN and anxiety contributing to this. See below  CHF exacerbation Currently on lasix 40 mg daily with kcl, this was increased dose over the weekend. With her crackles, change this to 40 mg bid x 3 days, then 40 mg daily x 3 days and then 20 mg daily. Change kcl to 20 meq bid x 3 days, then 20 meq daily x 3 days, then 10 meq daily and check bmp and bnp in am  Right lung infiltrate Concern for aspiration PNA. Currently on doxycycline 100 mg q12h, albuterol 2.5 mg q6h prn. Afebrile. Concern for aspiration. SLP to work with patient. Will change her antibiotic to augmentin 875 mg q12h x 1 week. Change oxygen to 3l/min and monitor VS q shift. D/c albuterol and place her on duoneb nebulizer rx  Anxiety Start her on alprazolam 0.25 mg qhs x 3 days and bid prn and monitor  Critical AS Not a surgical candidate, likely for symptom to worsen.  Might benefit from palliative care consult   Family/ staff Communication: reviewed care plan with patient and her daughter in detail with nursing supervisor present. Answered all questions from her daughter in best possible capacity. Daughter agrees with her care plan for now.     Oneal GroutMAHIMA Arita Severtson, MD Internal Medicine [facility] [address] [place], [place] [address] Cell Phone (Monday-Friday 8 am - 5 pm): [phone] On Call: [phone] and follow prompts after 5 pm and on weekends Office Phone: [phone] Office Fax: [phone]

## 2016-06-20 ENCOUNTER — Non-Acute Institutional Stay (SKILLED_NURSING_FACILITY): Payer: Medicare Other | Admitting: Family

## 2016-06-20 DIAGNOSIS — E871 Hypo-osmolality and hyponatremia: Secondary | ICD-10-CM

## 2016-06-20 DIAGNOSIS — D72829 Elevated white blood cell count, unspecified: Secondary | ICD-10-CM | POA: Diagnosis not present

## 2016-06-20 DIAGNOSIS — E440 Moderate protein-calorie malnutrition: Secondary | ICD-10-CM

## 2016-06-20 MED ORDER — SODIUM CHLORIDE 1 G PO TABS: 1.0000 g | ORAL_TABLET | Freq: Once | ORAL | Status: AC

## 2016-06-20 MED ORDER — POTASSIUM CHLORIDE CRYS ER 20 MEQ PO TBCR: 20.0000 meq | EXTENDED_RELEASE_TABLET | Freq: Every day | ORAL | 0 refills | Status: DC

## 2016-06-20 MED ORDER — FUROSEMIDE 40 MG PO TABS: 20.0000 mg | ORAL_TABLET | Freq: Two times a day (BID) | ORAL | Status: DC

## 2016-06-20 NOTE — Progress Notes (Signed)
Location:  [facility] Room Number: 1005 A  Place of Service:  SNF (31) Provider:  Ifeanyi Mickelson FNP-C   Oneal Grout, MD  Patient Care Team: Oneal Grout, MD as PCP - General (Internal Medicine)  Extended Emergency Contact Information Primary Emergency Contact: Tobi Bastos [place] Mobile Phone: [phone] Relation: Daughter  Code Status: Full Code  Goals of care: Advanced Directive information Advanced Directives 06/05/2016  Does patient have an advance directive? No  Would patient like information on creating an advanced directive? -     Chief Complaint  Patient presents with  . Acute Visit    HPI:  Pt is a 82 y.o. female seen today at [facility]  for an acute visit for evaluation of abnormal lab results. She is seen in her room today. She is currently on Augmentin 875/125 mg Tablet started 06/19/2016 previously on Doxycycline for recent right lobe PNA. She denies any fever or chills. She states coughing her impoved. Her recent lab results showed WBC 12.2, Hgb 10.0, HCT 28.9, Na 117, CL 85, TP 5.7, Alb 3.08 ( 06/19/2016). Patient's daughter at bedside states planned to send patient to hospital but states would like to wait until 06/21/2016 to discuss patient's condition with MD given her advanced age, CHF and severe Aortic Stenosis previously told by Cardiologist not a surgical candidate. Thus considering palliative care consults.Plan of care discussed with patient's daughter in presence of facility Nurse supervisor.    Past Medical History:  Diagnosis Date  . Anemia   . Elevated troponin 03/04/2016  . Heart murmur   . History of falling   . Pneumonia 03/03/2016  . Pressure ulcer 03/04/2016  . Prolonged Q-T interval on ECG 03/04/2016  . Rhabdomyolysis 03/04/2016   Past Surgical History:  Procedure Laterality Date  . CHOLECYSTECTOMY    . OOPHORECTOMY     rt and lt     No Known Allergies      Medication List       Accurate as of 06/20/16  3:06 PM. Always use your most recent med list.          ALPRAZolam 0.25 MG tablet Commonly known as:  XANAX Take 0.25 mg by mouth at bedtime as needed for anxiety. Then take 0.25 mg every 12 Hrs PRN for sever anxiety.   amoxicillin-clavulanate 875-125 MG tablet Commonly known as:  AUGMENTIN Take 1 tablet by mouth 2 (two) times daily.   Ben Gay Arthritis Formula 8-30 % Crea Apply to left shoulder as needed for pain   CALTRATE GUMMY BITES 250-400 MG-UNIT Chew Generic drug:  Ca Phosphate-Cholecalciferol Chew 2 each by mouth daily.   CEROVITE ADVANCED FORMULA Liqd Take by mouth. Take 15 ml by mouth daily with food   DUONEB 0.5-2.5 (3) MG/3ML Soln Generic drug:  ipratropium-albuterol Take 3 mLs by nebulization every 8 (eight) hours. Then start every 8 HRS PRN for dyspnea   furosemide 40 MG tablet Commonly known as:  LASIX Take 0.5 tablets (20 mg total) by mouth 2 (two) times daily. Then change to 20 mg tablet by mouth daily.   guaifenesin 100 MG/5ML syrup Commonly known as:  ROBITUSSIN Take by mouth 3 (three) times daily. Give 10 cc   NUTRITIONAL DRINK PO Take by mouth. With lunch and dinner for increased sensory integration and improved  Po intake   potassium chloride SA 20 MEQ tablet Commonly known as:  K-DUR,KLOR-CON Take 1 tablet (20 mEq total) by  mouth daily. Then decrease to 10 meq tablet by mouth daily.       Review of Systems  Constitutional: Negative for activity change, appetite change, chills, fatigue and fever.  HENT: Negative for congestion, rhinorrhea, sinus pressure, sneezing and sore throat.   Eyes: Negative.   Respiratory: Positive for cough. Negative for choking, chest tightness, shortness of breath and wheezing.   Cardiovascular: Negative for chest pain, palpitations and leg swelling.  Gastrointestinal: Negative for abdominal distention, abdominal pain, constipation, diarrhea, nausea and vomiting.   Genitourinary: Negative for dysuria, flank pain and urgency.  Musculoskeletal: Positive for gait problem.  Skin: Negative for color change, pallor, rash and wound.  Neurological: Negative for dizziness, seizures, syncope, light-headedness and headaches.  Psychiatric/Behavioral: Negative for agitation, confusion, hallucinations and sleep disturbance. The patient is not nervous/anxious.     Immunization History  Administered Date(s) Administered  . PPD Test 06/16/2016   Pertinent  Health Maintenance Due  Topic Date Due  . DEXA SCAN  02/16/1991  . PNA vac Low Risk Adult (1 of 2 - PCV13) 02/16/1991  . INFLUENZA VACCINE  04/11/2016      Vitals:   06/20/16 1230  BP: (!) (P) 145/55  Pulse: (!) 58  Resp: 20  Temp: 97.7 F (36.5 C)  SpO2: 97%  Weight: 95 lb 6.4 oz (43.3 kg)  Height: 5\' 6"  (1.676 m)   Body mass index is 15.4 kg/m. Physical Exam  Constitutional: She appears well-developed and well-nourished. No distress.  Frail pleasant Elderly in no acute distress  HENT:  Head: Normocephalic.  Mouth/Throat: Oropharynx is clear and moist. No oropharyngeal exudate.  Eyes: Conjunctivae and EOM are normal. Pupils are equal, round, and reactive to light. Right eye exhibits no discharge. Left eye exhibits no discharge. No scleral icterus.  Neck: Normal range of motion. No JVD present. No thyromegaly present.  Cardiovascular: Intact distal pulses.  Exam reveals no gallop and no friction rub.   Aortic Stenosis   Pulmonary/Chest: Effort normal. No respiratory distress. She has no wheezes. She has no rales.  Oxygen 3 liters via Nasal cannula in place. Bilateral lower lobes diminished much improvement from previous assessment.   Abdominal: Soft. Bowel sounds are normal. She exhibits no distension. There is no tenderness. There is no rebound and no guarding.  Genitourinary:  Genitourinary Comments: Incontinent for both B/B   Musculoskeletal: She exhibits no edema or tenderness.  Abnormal  gait. Moves X 4 extremities.   Lymphadenopathy:    She has no cervical adenopathy.  Neurological: She is alert.  Skin: Skin is warm and dry. No rash noted. No erythema. No pallor.  Psychiatric: She has a normal mood and affect.    Labs reviewed:  Recent Labs  03/04/16 0039 03/04/16 0625 03/05/16 0510  03/20/16 03/22/16 03/28/16  NA 133* 133* 134*  < > 128* 130* 128*  K 3.5 3.1* 4.2  < > 6.0* 4.9 4.9  CL 91* 97* 100*  --   --   --   --   CO2  --  28 29  --   --   --   --   GLUCOSE 96 118* 108*  --   --   --   --   BUN 25* 24* 25*  < > 17 18 19   CREATININE 0.90 0.68 0.71  < > 0.6 0.6 0.5  CALCIUM  --  7.9* 7.9*  --   --   --   --   MG  --  1.9  --   --   --   --   --   < > =  values in this interval not displayed.  Recent Labs  03/04/16 0625  AST 55*  ALT 29  ALKPHOS 45  BILITOT 2.3*  PROT 5.6*  ALBUMIN 2.8*    Recent Labs  03/03/16 0030  03/04/16 0625 03/05/16 0510 03/28/16  WBC 13.9*  --  12.1* 10.5 6.0  NEUTROABS 11.6*  --   --   --   --   HGB 9.4*  < > 9.7* 8.5* 10.1*  HCT 27.6*  < > 28.7* 25.4* 31*  MCV 92.0  --  89.7 93.4  --   PLT 292  --  298 290 398  < > = values in this interval not displayed. Lab Results  Component Value Date   TSH 4.38 03/31/2016   Assessment/Plan 1. Hyponatremia Na 117 ( 06/19/2016) previous 128, 130.possible due to her recent increased Furosemide dosage secondary to CHF. Patient's daughter does not want patient send to hospital for now would like to wait considering possible palliative care given her advance age, CHF and severe Aortic Stenosis. Start Sodium Chloride 1Gm X one dose now then continue on 1Gm by mouth twice daily. Repeat BMP 06/22/2016.    2. Leukocytosis  WBC 12.2 ( 06/19/2016). Currently on Augmentin 875/125 mg Tablet twice daily. Will continue current Augmentin for now. Recheck CBC/diff 06/22/2016  3. Moderate protein-calorie malnutrition (HCC) TP5.7, ALB 3.08 ( 06/19/2016). RD consult for protein supplement.  Monitor BMP     Family/ staff Communication: Reviewed plan of care with Patient and patient's daughter in presence of facility Nurse supervisor.   Labs/tests ordered: CBC/diff, BMP 06/22/2016

## 2016-06-21 ENCOUNTER — Encounter: Payer: Self-pay | Admitting: Internal Medicine

## 2016-06-21 ENCOUNTER — Non-Acute Institutional Stay (SKILLED_NURSING_FACILITY): Payer: Medicare Other | Admitting: Internal Medicine

## 2016-06-21 DIAGNOSIS — J690 Pneumonitis due to inhalation of food and vomit: Secondary | ICD-10-CM

## 2016-06-21 DIAGNOSIS — R0602 Shortness of breath: Secondary | ICD-10-CM | POA: Diagnosis not present

## 2016-06-21 DIAGNOSIS — I350 Nonrheumatic aortic (valve) stenosis: Secondary | ICD-10-CM | POA: Diagnosis not present

## 2016-06-21 DIAGNOSIS — I5023 Acute on chronic systolic (congestive) heart failure: Secondary | ICD-10-CM

## 2016-06-21 DIAGNOSIS — E871 Hypo-osmolality and hyponatremia: Secondary | ICD-10-CM

## 2016-06-21 LAB — HEPATIC FUNCTION PANEL
ALT: 15 U/L (ref 7–35)
AST: 18 U/L (ref 13–35)
Alkaline Phosphatase: 57 U/L (ref 25–125)
BILIRUBIN, TOTAL: 1 mg/dL

## 2016-06-21 LAB — CBC AND DIFFERENTIAL
HEMATOCRIT: 29 % — AB (ref 36–46)
HEMOGLOBIN: 10.1 g/dL — AB (ref 12.0–16.0)
PLATELETS: 277 10*3/uL (ref 150–399)
WBC: 14.6 10*3/mL

## 2016-06-21 LAB — BASIC METABOLIC PANEL
BUN: 20 mg/dL (ref 4–21)
Creatinine: 0.7 mg/dL (ref 0.5–1.1)
GLUCOSE: 145 mg/dL
Potassium: 4.6 mmol/L (ref 3.4–5.3)
Sodium: 117 mmol/L — AB (ref 137–147)

## 2016-06-21 NOTE — Progress Notes (Addendum)
LOCATION: [facility]  PCP: Oneal Grout, MD   Code Status:  Full Code  Goals of care: Advanced Directive information Advanced Directives 06/05/2016  Does patient have an advance directive? No  Would patient like information on creating an advanced directive? -       Extended Emergency Contact Information Primary Emergency Contact: [facility] [place] Mobile Phone: [phone] Relation: Daughter   No Known Allergies  Chief Complaint  Patient presents with  . Acute Visit    Family Concerns     HPI:  Patient is a 82 y.o. female seen today for acute visit. Her daughter is at her bedside. She was started on antibiotic for pneumonia and her lasix dose was increased with her pulmonary edema few days back. She has clinically improved some with her breathing and cough. She continues to be short of breath with minimal exertion. She wants to go to the hospital.  She has history of critical AS, CHF and moderate pulmonary HTN.   Review of Systems:  Constitutional: Negative for fever, chills and diaphoresis. HENT: Negative for headache, congestion. Has hearing loss. Eyes: has poor vision Respiratory: Negative for wheezing. Positive for cough with white phlegm.  Cardiovascular: Negative for chest pain, palpitations, leg swelling.  Gastrointestinal: Negative for heartburn, nausea, vomiting, abdominal pain.  Genitourinary: Negative for dysuria Musculoskeletal: positive for back pain. Negative for fall in the facility.  Skin: Negative for itching, rash.  Neurological: Negative for dizziness.   Past Medical History:  Diagnosis Date  . Anemia   . Elevated troponin 03/04/2016  . Heart murmur   . History of falling   . Pneumonia 03/03/2016  . Pressure ulcer 03/04/2016  . Prolonged Q-T interval on ECG 03/04/2016  . Rhabdomyolysis 03/04/2016   Past Surgical History:  Procedure Laterality Date  . CHOLECYSTECTOMY    . OOPHORECTOMY     rt and lt       Medications:   Medication List       Accurate as of 06/21/16 12:18 PM. Always use your most recent med list.          ALPRAZolam 0.25 MG tablet Commonly known as:  XANAX Take 0.25 mg by mouth at bedtime as needed for anxiety. Then take 0.25 mg every 12 Hrs PRN for sever anxiety.   amoxicillin-clavulanate 875-125 MG tablet Commonly known as:  AUGMENTIN Take 1 tablet by mouth 2 (two) times daily.   Ben Gay Arthritis Formula 8-30 % Crea Apply to left shoulder as needed for pain   CALTRATE GUMMY BITES 250-400 MG-UNIT Chew Generic drug:  Ca Phosphate-Cholecalciferol Chew 2 each by mouth daily.   CEROVITE ADVANCED FORMULA Liqd Take by mouth. Take 15 ml by mouth daily with food   DUONEB 0.5-2.5 (3) MG/3ML Soln Generic drug:  ipratropium-albuterol Take 3 mLs by nebulization every 8 (eight) hours. Then start every 8 HRS PRN for dyspnea   furosemide 20 MG tablet Commonly known as:  LASIX Take 20 mg by mouth 2 (two) times daily. For 3 days and then decrease to 10 mg tablet   NUTRITIONAL DRINK PO Take by mouth. With lunch and dinner for increased sensory integration and improved  Po intake   potassium chloride SA 20 MEQ tablet Commonly known as:  K-DUR,KLOR-CON Take 1 tablet (20 mEq total) by mouth daily. Then decrease to 10 meq tablet by mouth daily.   sodium chloride 1 g tablet Take 1 g by mouth 2 (two) times daily.  Immunizations: Immunization History  Administered Date(s) Administered  . PPD Test 06/16/2016     Physical Exam: Vitals:   06/21/16 1159  BP: (!) 145/55  Pulse: (!) 56  Resp: 20  Temp: 97.7 F (36.5 C)  TempSrc: Oral  SpO2: 96%  Weight: 95 lb 6.4 oz (43.3 kg)  Height: 5\' 4"  (1.626 m)   Body mass index is 16.38 kg/m.  Wt Readings from Last 3 Encounters:  06/21/16 95 lb 6.4 oz (43.3 kg)  06/20/16 95 lb 6.4 oz (43.3 kg)  06/19/16 96 lb 11.2 oz (43.9 kg)   General- elderly female, frail and thin built, in no acute  distress Head- normocephalic, atraumatic Nose- no maxillary or frontal sinus tenderness, no nasal discharge Throat- moist mucus membrane, poor dentition  Eyes- no pallor, no icterus Neck- no cervical lymphadenopathy Cardiovascular- normal s1,s2, + murmur Respiratory- bilateral poor air entry, on 2l o2 by [place], no rhonchi, crackles +, no wheeze Abdomen- bowel sounds present, soft, non tender Musculoskeletal- able to move all 4 extremities, generalized weakness, kyphosis present Neurological- alert and oriented to person, place and time Skin- warm and dry, has floaters to both heels, stage 1 pressure ulcer to her lumbar spine Psychiatry- appears anxious    Labs reviewed: Basic Metabolic Panel:  Recent Labs  16/10/96 0039 03/04/16 0625 03/05/16 0510  03/20/16 03/22/16 03/28/16  NA 133* 133* 134*  < > 128* 130* 128*  K 3.5 3.1* 4.2  < > 6.0* 4.9 4.9  CL 91* 97* 100*  --   --   --   --   CO2  --  28 29  --   --   --   --   GLUCOSE 96 118* 108*  --   --   --   --   BUN 25* 24* 25*  < > 17 18 19   CREATININE 0.90 0.68 0.71  < > 0.6 0.6 0.5  CALCIUM  --  7.9* 7.9*  --   --   --   --   MG  --  1.9  --   --   --   --   --   < > = values in this interval not displayed. Liver Function Tests:  Recent Labs  03/04/16 0625  AST 55*  ALT 29  ALKPHOS 45  BILITOT 2.3*  PROT 5.6*  ALBUMIN 2.8*   No results for input(s): LIPASE, AMYLASE in the last 8760 hours. No results for input(s): AMMONIA in the last 8760 hours. CBC:  Recent Labs  03/03/16 0030  03/04/16 0625 03/05/16 0510 03/28/16  WBC 13.9*  --  12.1* 10.5 6.0  NEUTROABS 11.6*  --   --   --   --   HGB 9.4*  < > 9.7* 8.5* 10.1*  HCT 27.6*  < > 28.7* 25.4* 31*  MCV 92.0  --  89.7 93.4  --   PLT 292  --  298 290 398  < > = values in this interval not displayed. Cardiac Enzymes:  Recent Labs  03/03/16 0030 03/04/16 0246 03/04/16 0625 03/04/16 1129 03/05/16 0510  CKTOTAL 758* 696*  --   --  247*  CKMB 13.9*  --   --    --  8.3*  TROPONINI  --   --  0.22*  0.23* 0.19*  --    BNP: Invalid input(s): POCBNP CBG: No results for input(s): GLUCAP in the last 8760 hours.  Radiological Exams:  06/16/16 chest xray- bilateral pleural effusion with pulmonary congestion  more on right side, right sided pneumonia  03/24/16 chest xray- chf with small effusion and cardiomegaly, bilateral pneumonia  03/04/16 chest xray- bibasilar opacities left > right, right pleural effusion    Assessment/Plan   Dyspnea Likely multifactorial with her chf, pneumonia, severe AS, pulmonary HTN and anxiety contributing to this. Improved compared to 2 days back but pt and family insists on sending her to the ED. Sending pt to ED for further evaluation  Hyponatremia Has chronic hyponatremia. Currently her lasix dosing has been increased ad this could have further worsened her hyponatremia. Fluid restriction was to be attempted but family wants her to be sent to the ED  CHF exacerbation Elevated BNP. Currently on regimen of lasix 40 mg bid x 3 days, then 40 mg daily x 3 days and then 20 mg daily. Sending pt to ED for evaluation  Aspiration PNA Currently on augmentin  Critical AS Not a surgical candidate, likely for symptom to worsen.  Might benefit from palliative care consult. Sending to ED per family request   Family/ staff Communication: reviewed care plan with patient and her daughter in detail with nursing supervisor present. Informed daughter that lab work from this am is pending and we can decide further based on lab result. She would like patient sent to ED per patient request. Calling EMS and sending pt to ED for further evaluation   Oneal GroutMAHIMA Keymon Mcelroy, MD Internal Medicine [facility] [address] [place], [place] [address] Cell Phone (Monday-Friday 8 am - 5 pm): [phone] On Call: [phone] and follow prompts after 5 pm and on weekends Office Phone: [phone] Office Fax:  [phone]   Patient's daughter decided on not taking her to the ED. She feels that patient has been getting appropriate care here. Her breathing has improved compared to 2 days back. She would like for care to be continued here  Continue and complete course of po augmentin, monitor wbc and temp curve  Will get fluid restriction and start her on salt tablet  Continue increased dose of lasix, monitor her bmp   Oneal GroutMAHIMA Carah Barrientes, MD  [facility] [phone] (Monday-Friday 8 am - 5 pm) [phone] (afterhours)

## 2016-06-22 ENCOUNTER — Non-Acute Institutional Stay (SKILLED_NURSING_FACILITY): Payer: Medicare Other | Admitting: Family

## 2016-06-22 ENCOUNTER — Encounter: Payer: Self-pay | Admitting: Family

## 2016-06-22 DIAGNOSIS — I5032 Chronic diastolic (congestive) heart failure: Secondary | ICD-10-CM

## 2016-06-22 DIAGNOSIS — E440 Moderate protein-calorie malnutrition: Secondary | ICD-10-CM | POA: Diagnosis not present

## 2016-06-22 DIAGNOSIS — E871 Hypo-osmolality and hyponatremia: Secondary | ICD-10-CM | POA: Insufficient documentation

## 2016-06-22 DIAGNOSIS — D72829 Elevated white blood cell count, unspecified: Secondary | ICD-10-CM | POA: Diagnosis not present

## 2016-06-22 DIAGNOSIS — R790 Abnormal level of blood mineral: Secondary | ICD-10-CM

## 2016-06-22 DIAGNOSIS — D638 Anemia in other chronic diseases classified elsewhere: Secondary | ICD-10-CM

## 2016-06-22 MED ORDER — MAGNESIUM OXIDE 400 (241.3 MG) MG PO TABS: 400.0000 mg | ORAL_TABLET | Freq: Once | ORAL | Status: AC

## 2016-06-22 NOTE — Progress Notes (Signed)
Patient ID: Laura Sanchez, female   DOB: 06/20/1924, 82 y.o.   MRN: [id number]  Location:  [facility] Room Number: 1005 A Place of Service:  SNF (31) Provider: Dinah Ngetich FNP-C   Oneal GroutPANDEY, MAHIMA, MD  Patient Care Team: Oneal GroutMahima Pandey, MD as PCP - General (Internal Medicine)  Extended Emergency Contact Information Primary Emergency Contact: Tobi Bastosripp,Jean S  [place] Mobile Phone: [phone] Relation: Daughter  Code Status:  Full Code  Goals of care: Advanced Directive information Advanced Directives 06/22/2016  Does patient have an advance directive? No  Would patient like information on creating an advanced directive? No - patient declined information     Chief Complaint  Patient presents with  . Acute Visit    abnormal Labs    HPI:  Pt is a 82 y.o. female seen today at [facility] for an acute  visit for evaluation of abnormal lab results. She is seen in her room today. She states her coughing has improved but still some Short of breath with exertion. She denies any fever, chills or wheezing. Her recent lab results showed Mg 1.6, Hgb 10.1 (previous 10.0), HCT 28.7, Na 117, TP 5.8, Alb 3.07 ( 06/21/2016). She was seen by MD 06/21/2016 was referred to ED but patient's daughter changed her mind for patient to remain at the facility. Facility staff reports no new concerns.    Past Medical History:  Diagnosis Date  . Anemia   . Elevated troponin 03/04/2016  . Heart murmur   . History of falling   . Pneumonia 03/03/2016  . Pressure ulcer 03/04/2016  . Prolonged Q-T interval on ECG 03/04/2016  . Rhabdomyolysis 03/04/2016   Past Surgical History:  Procedure Laterality Date  . CHOLECYSTECTOMY    . OOPHORECTOMY     rt and lt     No Known Allergies    Medication List       Accurate as of 06/22/16 12:07 PM. Always use your most recent med list.          Crista ElliotBen Gay Arthritis Formula 8-30 % Crea Apply to  left shoulder as needed for pain   CALTRATE GUMMY BITES 250-400 MG-UNIT Chew Generic drug:  Ca Phosphate-Cholecalciferol Chew 2 each by mouth daily.   ipratropium-albuterol 0.5-2.5 (3) MG/3ML Soln Commonly known as:  DUONEB Take 3 mLs by nebulization every 6 (six) hours as needed.   DUONEB 0.5-2.5 (3) MG/3ML Soln Generic drug:  ipratropium-albuterol Take 3 mLs by nebulization every 8 (eight) hours. Then start every 8 HRS PRN for dyspnea   furosemide 20 MG tablet Commonly known as:  LASIX Take 20 mg by mouth 2 (two) times daily. Take 20 mg by mouth for three days then decrease to 10 mg daily   NUTRITIONAL DRINK PO Take by mouth. With lunch and dinner for increased sensory integration and improved  Po intake   potassium chloride SA 20 MEQ tablet Commonly known as:  K-DUR,KLOR-CON Start 20 meq tablet by mouth daily for 3 days then decrease to 10 meq daily   sodium chloride 1 g tablet Take 1 g by mouth 2 (two) times daily.       Review of Systems  Constitutional: Negative for activity change, appetite change, chills, fatigue and fever.  HENT: Negative for congestion, rhinorrhea, sinus pressure, sneezing and sore throat.   Eyes: Negative.   Respiratory: Positive for cough. Negative for choking, chest tightness and wheezing.  Shortness of breath with exertion has improved.   Cardiovascular: Negative for chest pain, palpitations and leg swelling.  Gastrointestinal: Negative for abdominal distention, abdominal pain, constipation, diarrhea, nausea and vomiting.  Genitourinary: Negative for dysuria, flank pain and urgency.  Musculoskeletal: Positive for gait problem.  Skin: Negative for color change, pallor, rash and wound.  Neurological: Negative for dizziness, seizures, syncope, light-headedness and headaches.  Psychiatric/Behavioral: Negative for agitation, confusion, hallucinations and sleep disturbance. The patient is not nervous/anxious.     Immunization History    Administered Date(s) Administered  . PPD Test 06/16/2016   Pertinent  Health Maintenance Due  Topic Date Due  . DEXA SCAN  02/16/1991  . PNA vac Low Risk Adult (1 of 2 - PCV13) 02/16/1991  . INFLUENZA VACCINE  04/11/2016   No flowsheet data found. Functional Status Survey:    Vitals:   06/22/16 1154  BP: (!) 118/58  Pulse: 64  Resp: 16  Temp: 98 F (36.7 C)  TempSrc: Oral  SpO2: 97%  Weight: 95 lb 6.4 oz (43.3 kg)  Height: 5\' 4"  (1.626 m)   Body mass index is 16.38 kg/m. Physical Exam  Constitutional: She appears well-developed and well-nourished. No distress.  Frail Elderly in no acute distress  HENT:  Head: Normocephalic.  Mouth/Throat: Oropharynx is clear and moist. No oropharyngeal exudate.  Eyes: Conjunctivae and EOM are normal. Pupils are equal, round, and reactive to light. Right eye exhibits no discharge. Left eye exhibits no discharge. No scleral icterus.  Neck: Normal range of motion. No JVD present. No thyromegaly present.  Cardiovascular: Intact distal pulses.  Exam reveals no gallop and no friction rub.   Severe Aortic Stenosis   Pulmonary/Chest: Effort normal. No respiratory distress. She has no wheezes. She has no rales.  Oxygen 3 liters via Nasal cannula in place. Bilateral diminished bases.    Abdominal: Soft. Bowel sounds are normal. She exhibits no distension. There is no tenderness. There is no rebound and no guarding.  Genitourinary:  Genitourinary Comments: Incontinent for both B/B   Musculoskeletal: She exhibits no edema or tenderness.  Abnormal gait. Moves X 4 extremities.   Lymphadenopathy:    She has no cervical adenopathy.  Neurological: She is alert.  Skin: Skin is warm and dry. No rash noted. No erythema. No pallor.  Psychiatric: She has a normal mood and affect.    Labs reviewed:  Recent Labs  03/04/16 0039 03/04/16 0625 03/05/16 0510  03/22/16 03/28/16 06/21/16  NA 133* 133* 134*  < > 130* 128* 117*  K 3.5 3.1* 4.2  < > 4.9  4.9 4.6  CL 91* 97* 100*  --   --   --   --   CO2  --  28 29  --   --   --   --   GLUCOSE 96 118* 108*  --   --   --   --   BUN 25* 24* 25*  < > 18 19 20   CREATININE 0.90 0.68 0.71  < > 0.6 0.5 0.7  CALCIUM  --  7.9* 7.9*  --   --   --   --   MG  --  1.9  --   --   --   --   --   < > = values in this interval not displayed.  Recent Labs  03/04/16 0625 06/21/16  AST 55* 18  ALT 29 15  ALKPHOS 45 57  BILITOT 2.3*  --   PROT 5.6*  --  ALBUMIN 2.8*  --     Recent Labs  03/03/16 0030  03/04/16 0625 03/05/16 0510 03/28/16 06/21/16  WBC 13.9*  --  12.1* 10.5 6.0 14.6  NEUTROABS 11.6*  --   --   --   --   --   HGB 9.4*  < > 9.7* 8.5* 10.1* 10.1*  HCT 27.6*  < > 28.7* 25.4* 31* 29*  MCV 92.0  --  89.7 93.4  --   --   PLT 292  --  298 290 398 277  < > = values in this interval not displayed. Lab Results  Component Value Date   TSH 4.38 03/31/2016   Assessment/Plan Low Magnesium level  Mg level 1.6 ( 06/21/16). Magnesium oxide 400 mg Tablet by mouth X 1 dose now. Recheck Mg level 06/26/2016.   CHF No weight gain.Negative exam findings to auscultation. Slight shortness of breath with exertion noted. Continue current diuretics. Fluid restriction.   Hyponatremia  Na+ 117( 06/21/2016). Sodium chloride 1 Gm ordered on 06/20/2016 but was not initiated prior to lab work. Continue Sodium chloride 1 Gm twice daily. Limit Fluid intake to one liter per day. Recheck BMP 06/26/2016.   Anemia  Stable. Hg 10.1 ( 06/21/2016) previous was 10.1  Pneumonia  Afebrile. Continue on Augmentin. Monitor temp curve.   Leukocytosis  WBC 14.6 ( 06/21/2016). Afebrile. Continue current antibiotics. Recheck CBC/diff 06/26/2016.   Protein Malnutrition  TP 5.8, Alb 3.07 ( 06/21/2016). RD consulted for protein supplements.    Family/ staff Communication: Reviewed plan of care with Dr. Glade Lloyd, patient and facility Nurse supervisor.  Labs/tests ordered:  CBC/diff,  BMP, Mg level  06/26/2016.

## 2016-06-27 ENCOUNTER — Non-Acute Institutional Stay (SKILLED_NURSING_FACILITY): Payer: Medicare Other | Admitting: Family

## 2016-06-27 ENCOUNTER — Telehealth: Payer: Self-pay | Admitting: Internal Medicine

## 2016-06-27 DIAGNOSIS — D649 Anemia, unspecified: Secondary | ICD-10-CM

## 2016-06-27 DIAGNOSIS — F411 Generalized anxiety disorder: Secondary | ICD-10-CM | POA: Diagnosis not present

## 2016-06-27 DIAGNOSIS — R790 Abnormal level of blood mineral: Secondary | ICD-10-CM

## 2016-06-27 DIAGNOSIS — R2681 Unsteadiness on feet: Secondary | ICD-10-CM | POA: Diagnosis not present

## 2016-06-27 DIAGNOSIS — E871 Hypo-osmolality and hyponatremia: Secondary | ICD-10-CM

## 2016-06-27 DIAGNOSIS — I5032 Chronic diastolic (congestive) heart failure: Secondary | ICD-10-CM

## 2016-06-27 NOTE — Telephone Encounter (Signed)
New Message  Pt [place] call [place] Pt daughter would like a recommendation on cardiologist in that area. Please call back to discuss

## 2016-07-06 NOTE — Telephone Encounter (Signed)
Called emergency contact/daughter, Carney BernJean. Left voice message to call back. Daughter had called requesting a cardiology referral for the [place].

## 2016-07-06 NOTE — Telephone Encounter (Signed)
F/u Message  Pt daughter called to f/u on the [place]. Please call back to discuss

## 2016-07-07 NOTE — Telephone Encounter (Signed)
NEW message  Pt's daughter calling to return call  [place]

## 2016-07-07 NOTE — Telephone Encounter (Signed)
WILL FORWARD TO  DR  Ladona RidgelAYLOR  FOR   RECOMMENDATIONS FOR  CARDIOLOGISTS  IN [place] .Zack Seal/CY

## 2016-07-10 NOTE — Telephone Encounter (Signed)
Dtr states pt is doing so much better in new rehab facility. Per Dr. Ladona Ridgelaylor - gave dtr Dr. Sherren KernsMark Mitchell [place] and follow up with. Patient verbalized understanding and agreeable to plan.

## 2016-07-27 ENCOUNTER — Telehealth: Payer: Self-pay

## 2016-07-27 NOTE — Telephone Encounter (Signed)
Sent documents to [facility] records [facility] [phone]. Dr. Ladona Ridgelaylor stated "May proceed with cataract surgery. Low risk".

## 2016-08-11 DEATH — deceased

## 2016-08-16 ENCOUNTER — Ambulatory Visit: Payer: Medicare Other | Admitting: Internal Medicine

## 2017-01-25 NOTE — Progress Notes (Signed)
Location:  [facility] Room Number: 1005 A  Place of Service:  SNF (31)  Provider: Richarda Blade FNP-C   PCP: Oneal Grout, MD Patient Care Team: Oneal Grout, MD as PCP - General (Internal Medicine)  Extended Emergency Contact Information Primary Emergency Contact: Tobi Bastos [place] Mobile Phone: [phone] Relation: Daughter  Code Status: Full  code  Goals of care:  Advanced Directive information Advanced Directives 06/22/2016  Does Patient Have a Medical Advance Directive? No  Would patient like information on creating a medical advance directive? No - patient declined information     No Known Allergies  Chief Complaint  Patient presents with  . Discharge Note    HPI:  83 y.o. female seen today at [facility] for discharge home.She was here for short term rehabilitation for post hospital admission from 03/03/2016-03/07/2016 with acute respiratory failure from community acquired pneumonia and acute CHF exacerbation post iv fluids. She was treated with antibiotics.She had rhabdomylosis post fall and ARF and had received iv fluids.She was found to have critical AS and moderate pulmonary HTN and was seen by cardiology.Not a surgical candidate.she has a medical history of Pulmonary HTN, Severe Aortic stenosis, Anemia among other conditions. She is seen in her room today. She denies any acute issues this visit. She recently completed oral antibiotics for pneumonia. MD recommended Palliative care due to her severe Aortic Stenosis.   She has worked with PT/OT but limited due her shortness of breath with slight exertion.She will be discharged home with Home health PT/OT to continue with ROM, Exercise, Gait stability and muscle strengthening.She will also require a HH RN for stage 2 sacral ulcer care. She will require  DME recline back wheelchair with cushion, anti tippers, extended brake handles, removable  elevating leg rests and oxygen holder  to enable her to maintain current level of independence with ADL's which cannot be achieved with walker or cane.Home health services will be arranged by facility social worker prior to discharge. She will be discharge from the facility with her medications.Prescription medication will be written x 1 month then patient to follow up with PCP in 1-2 weeks.Facility staff report no new concerns.    Past Medical History:  Diagnosis Date  . Anemia   . Elevated troponin 03/04/2016  . Heart murmur   . History of falling   . Pneumonia 03/03/2016  . Pressure ulcer 03/04/2016  . Prolonged Q-T interval on ECG 03/04/2016  . Rhabdomyolysis 03/04/2016    Past Surgical History:  Procedure Laterality Date  . CHOLECYSTECTOMY    . OOPHORECTOMY     rt and lt       reports that she has never smoked. She has never used smokeless tobacco. She reports that she does not drink alcohol or use drugs. Social History   Social History  . Marital status: Single    Spouse name: N/A  . Number of children: N/A  . Years of education: N/A   Occupational History  . retired Quarry manager    Social History Main Topics  . Smoking status: Never Smoker  . Smokeless tobacco: Never Used  . Alcohol use No  . Drug use: No  . Sexual activity: No   Other Topics Concern  . Not on file   Social History Narrative   Admitted to [facility] 03/07/16   Never married   Never smoked   Alcohol none   Full Code  No Known Allergies  Pertinent  Health Maintenance Due  Topic Date Due  . DEXA SCAN  02/16/1991  . PNA vac Low Risk Adult (1 of 2 - PCV13) 02/16/1991  . INFLUENZA VACCINE  04/11/2017    Medications: Allergies as of 06/27/2016   No Known Allergies     Medication List       Accurate as of 06/27/16 11:59 PM. Always use your most recent med list.          ALPRAZolam 0.25 MG tablet Commonly known as:  XANAX Take 0.25 mg by mouth 2 (two) times daily as  needed for anxiety.   Ben Gay Arthritis Formula 8-30 % Crea Apply to left shoulder as needed for pain   CALTRATE GUMMY BITES 250-400 MG-UNIT Chew Generic drug:  Ca Phosphate-Cholecalciferol Chew 2 each by mouth daily.   ipratropium-albuterol 0.5-2.5 (3) MG/3ML Soln Commonly known as:  DUONEB Take 3 mLs by nebulization every 8 (eight) hours as needed.   DUONEB 0.5-2.5 (3) MG/3ML Soln Generic drug:  ipratropium-albuterol Take 3 mLs by nebulization every 8 (eight) hours. Then start every 8 HRS PRN for dyspnea   furosemide 20 MG tablet Commonly known as:  LASIX Take 20 mg by mouth 2 (two) times daily. Take 20 mg by mouth for three days then decrease to 10 mg daily   NUTRITIONAL DRINK PO Take by mouth. With lunch and dinner for increased sensory integration and improved  Po intake   potassium chloride 10 MEQ tablet Commonly known as:  K-DUR,KLOR-CON Take 10 mEq by mouth daily. Start 20 meq tablet by mouth daily for 3 days then decrease to 10 meq daily   sodium chloride 1 g tablet Take 1 g by mouth 2 (two) times daily.       Review of Systems  Constitutional: Negative for activity change, appetite change, chills, fatigue and fever.  HENT: Negative for congestion, rhinorrhea, sinus pressure, sneezing and sore throat.   Eyes: Negative.   Respiratory: Negative for cough, choking, chest tightness and wheezing.        Shortness of breath with exertion at times though much improvement on diuretics   Cardiovascular: Negative for chest pain, palpitations and leg swelling.  Gastrointestinal: Negative for abdominal distention, abdominal pain, constipation, diarrhea, nausea and vomiting.  Endocrine: Negative.   Genitourinary: Negative for dysuria, flank pain, frequency and urgency.  Musculoskeletal: Positive for gait problem.  Skin: Negative for color change, pallor, rash and wound.  Neurological: Negative for dizziness, seizures, syncope, light-headedness and headaches.  Hematological:  Does not bruise/bleed easily.  Psychiatric/Behavioral: Negative for agitation, confusion, hallucinations and sleep disturbance. The patient is not nervous/anxious.     Vitals:   06/27/16 1115  BP: 128/64  Pulse: 72  Resp: (!) 22  Temp: 97.8 F (36.6 C)  SpO2: 98%  Weight: 88 lb (39.9 kg)  Height: 5\' 4"  (1.626 m)   Body mass index is 15.11 kg/m. Physical Exam  Constitutional: She appears well-developed and well-nourished. No distress.  Frail elderly in no acute distress  HENT:  Head: Normocephalic.  Mouth/Throat: Oropharynx is clear and moist. No oropharyngeal exudate.  Eyes: Conjunctivae and EOM are normal. Pupils are equal, round, and reactive to light. Right eye exhibits no discharge. Left eye exhibits no discharge. No scleral icterus.  Neck: Normal range of motion. No JVD present. No thyromegaly present.  Cardiovascular: Intact distal pulses.  Exam reveals no gallop and no friction rub.   Severe Aortic Stenosis   Pulmonary/Chest: Effort normal and breath  sounds normal. No respiratory distress. She has no wheezes. She has no rales.  Oxygen 3 liters via Nasal cannula in place  Abdominal: Soft. Bowel sounds are normal. She exhibits no distension. There is no tenderness. There is no rebound and no guarding.  Genitourinary:  Genitourinary Comments: Incontinent  Musculoskeletal: She exhibits no edema or tenderness.   Moves X 4 extremities.unsteady gait    Lymphadenopathy:    She has no cervical adenopathy.  Neurological: She is alert.  Skin: Skin is warm and dry. No rash noted. No erythema. No pallor.  Psychiatric: She has a normal mood and affect.    Labs reviewed: Basic Metabolic Panel:  Recent Labs  45/40/9806/24/17 0039 03/04/16 0625 03/05/16 0510  03/22/16 03/28/16 06/21/16  NA 133* 133* 134*  < > 130* 128* 117*  K 3.5 3.1* 4.2  < > 4.9 4.9 4.6  CL 91* 97* 100*  --   --   --   --   CO2  --  28 29  --   --   --   --   GLUCOSE 96 118* 108*  --   --   --   --   BUN 25*  24* 25*  < > 18 19 20   CREATININE 0.90 0.68 0.71  < > 0.6 0.5 0.7  CALCIUM  --  7.9* 7.9*  --   --   --   --   MG  --  1.9  --   --   --   --   --   < > = values in this interval not displayed. Liver Function Tests:  Recent Labs  03/04/16 0625 06/21/16  AST 55* 18  ALT 29 15  ALKPHOS 45 57  BILITOT 2.3*  --   PROT 5.6*  --   ALBUMIN 2.8*  --    CBC:  Recent Labs  03/03/16 0030  03/04/16 0625 03/05/16 0510 03/28/16 06/21/16  WBC 13.9*  --  12.1* 10.5 6.0 14.6  NEUTROABS 11.6*  --   --   --   --   --   HGB 9.4*  < > 9.7* 8.5* 10.1* 10.1*  HCT 27.6*  < > 28.7* 25.4* 31* 29*  MCV 92.0  --  89.7 93.4  --   --   PLT 292  --  298 290 398 277  < > = values in this interval not displayed. Cardiac Enzymes:  Recent Labs  03/03/16 0030 03/04/16 0246 03/04/16 0625 03/04/16 1129 03/05/16 0510  CKTOTAL 758* 696*  --   --  247*  CKMB 13.9*  --   --   --  8.3*  TROPONINI  --   --  0.22*  0.23* 0.19*  --    Assessment/Plan:    1. Chronic diastolic congestive heart failure No recent weight changes.   2. Hyponatremia Continue on sodium chloride. BMP in 1-2 weeks with PCP  3. Anemia Hgb 10.1 ( 06/21/2016) previous 9.7. Continue to monitor. CBC in 1-2 weeks with PCP.   4. Generalized anxiety disorder Continue on alprazolam 0.25 mg tablet twice daily as needed.   5. Unsteady gait Has worked with PT/ OT but limited due to shortness of breath.Will discharge home with PT/OT to continue with ROM, Exercise,Gait stability and muscle strengthening.She will require a HH RN for stage 2 sacral ulcer care. She will require  DME recline back wheelchair with cushion, anti tippers, extended brake handles, removable elevating leg rests and oxygen holder  to enable her to  maintain current level of independence with ADL's which cannot be achieved with walker or cane. Fall and safety precautions.  6. Low magnesium level Continue on Magnesium oxide daily. Monitor Mg level.   Patient is being  discharged with the following home health services:   -PT/OT for ROM, exercise, gait stability and muscle strengthening  -  HH RN for wound care management   Patient is being discharged with the following durable medical equipment:    - A recline back wheelchair with cushion, anti tippers, extended brake handles, removable elevating leg rests and oxygen holder  to enable her to maintain current level of independence with ADL's which cannot be achieved with walker or cane.   Patient has been advised to f/u with their PCP in 1-2 weeks to for a transitions of care visit.Social services at their facility was responsible for arranging this appointment.  Pt was provided with adequate prescriptions of noncontrolled medications to reach the scheduled appointment.For controlled substances, a limited supply was provided as appropriate for the individual patient. If the pt normally receives these medications from a pain clinic or has a contract with another physician, these medications should be received from that clinic or physician only).    Future labs/tests needed:  CBC, BMP in 1-2 weeks PCP

## 2017-12-05 IMAGING — CT CT HEAD W/O CM
5 of 8 series · 17 of 47 positions shown, 18 images · non-contrast
Comparison: None.

CLINICAL DATA: [AGE] female with fall

EXAM:
CT HEAD WITHOUT CONTRAST
CT CERVICAL SPINE WITHOUT CONTRAST
TECHNIQUE: Multidetector CT imaging of the head and cervical spine was
performed following the standard protocol without intravenous
contrast. Multiplanar CT image reconstructions of the cervical spine
were also generated.

[Series 3: head w/o · axial · non-contrast · 0.38mm/px · z∈[-153,-73]mm · 3 of 34 slices shown, 4 images]
[im 9/34  brain]
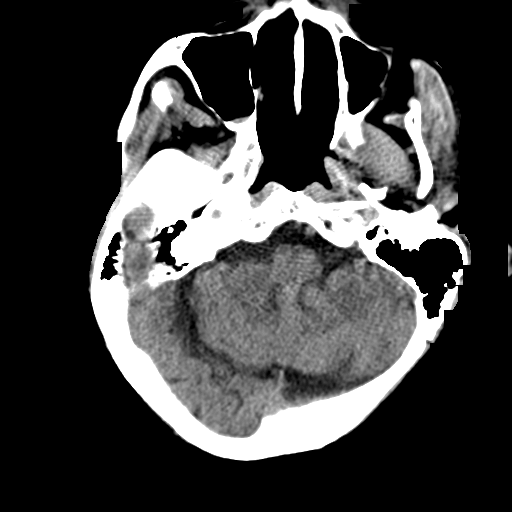
[im 9/34  bone]
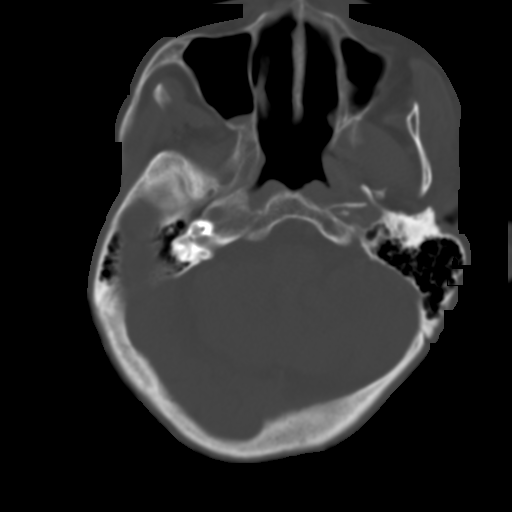
[im 17/34  brain]
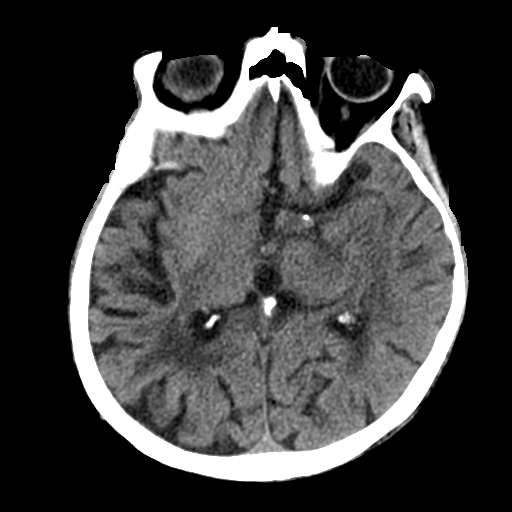
[im 25/34  brain]
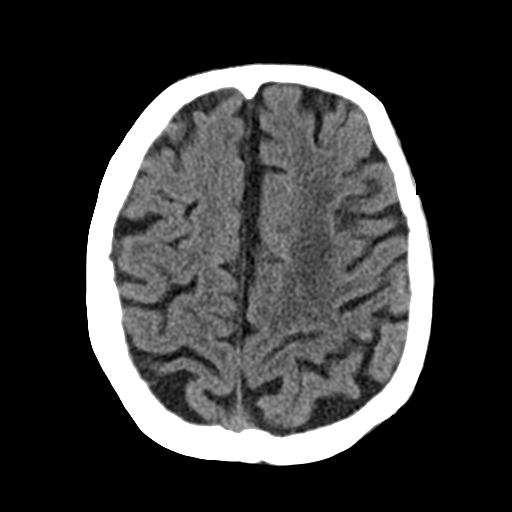

[Series 4: bone windows · axial · 0.38mm/px · z∈[-153,-73]mm · 3 of 34 slices shown]
[im 9/34  bone]
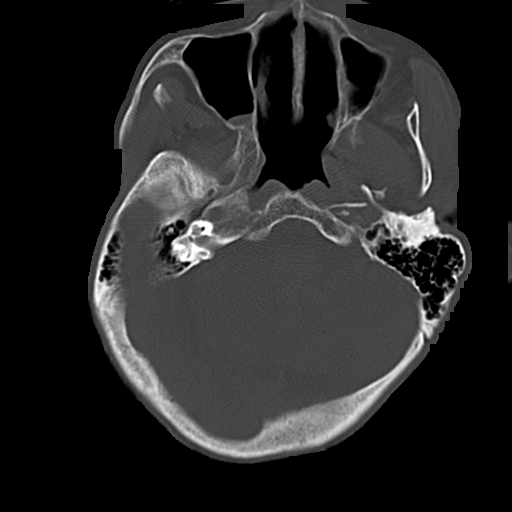
[im 17/34  bone]
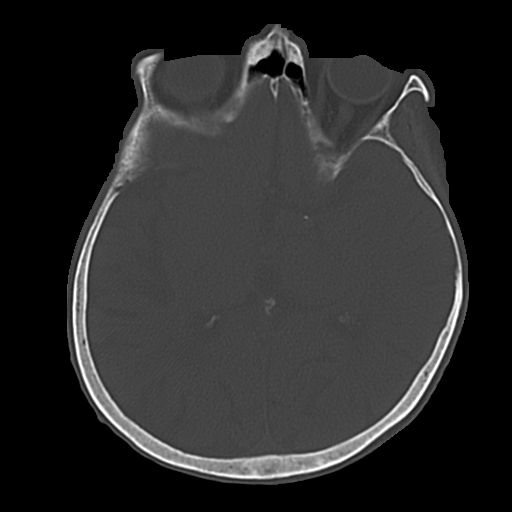
[im 25/34  bone]
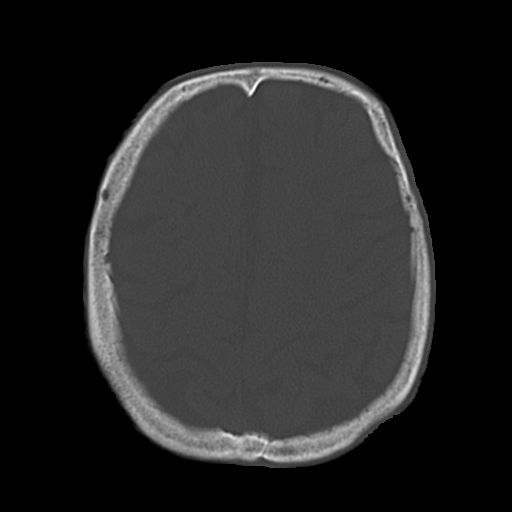

[Series 7: sagittal · sagittal · 0.31mm/px · 1 of 53 slices shown]
[im 27/53  brain]
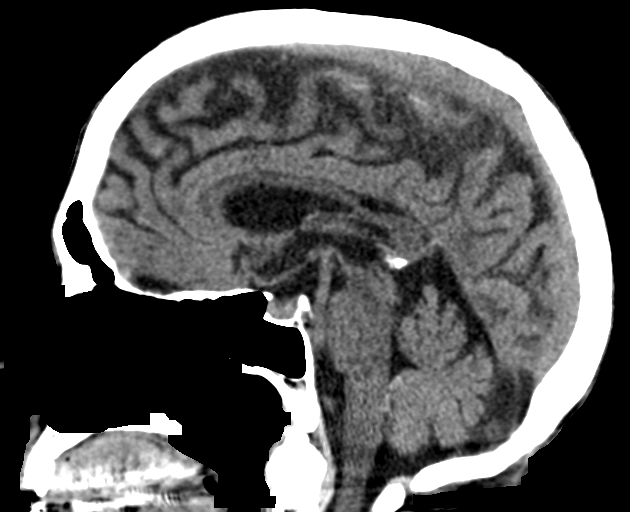

[Series 11: axial recon · axial · 0.23mm/px · z∈[-318,-218]mm · 8 of 73 slices shown]
[im 9/73  brain]
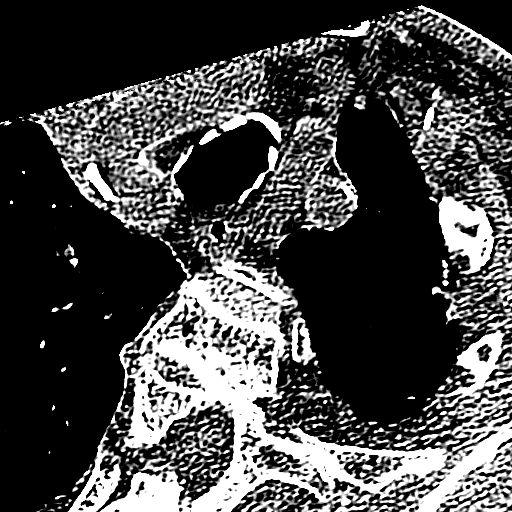
[im 17/73  brain]
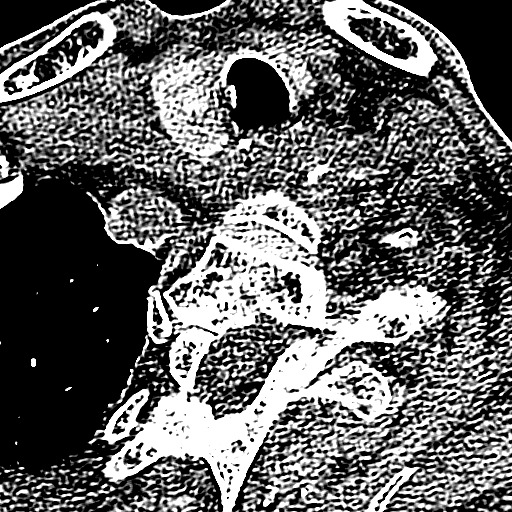
[im 25/73  brain]
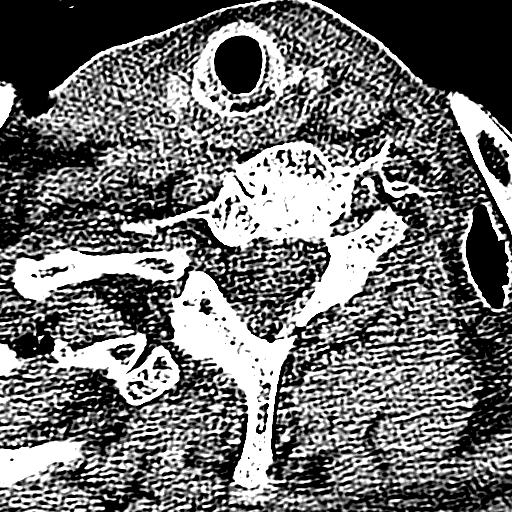
[im 33/73  brain]
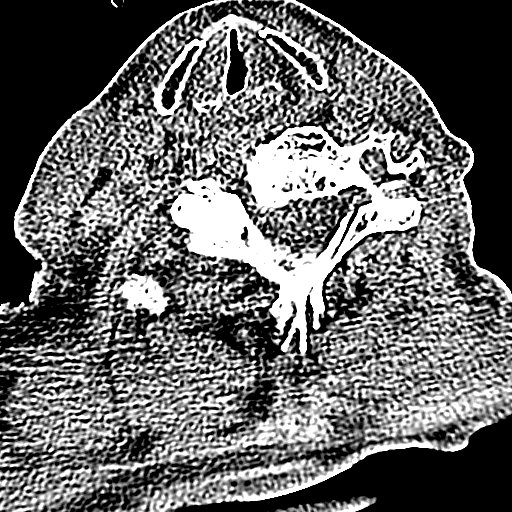
[im 41/73  brain]
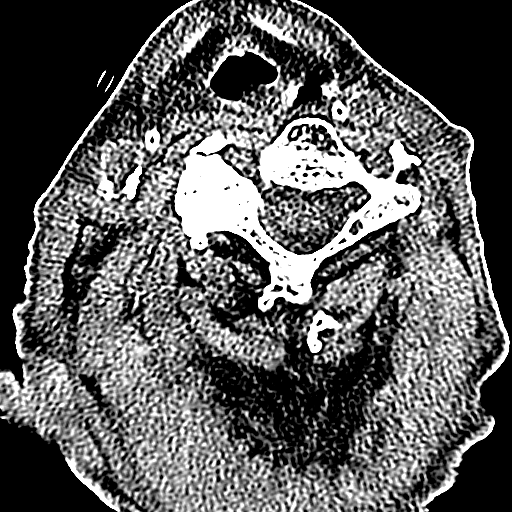
[im 49/73  brain]
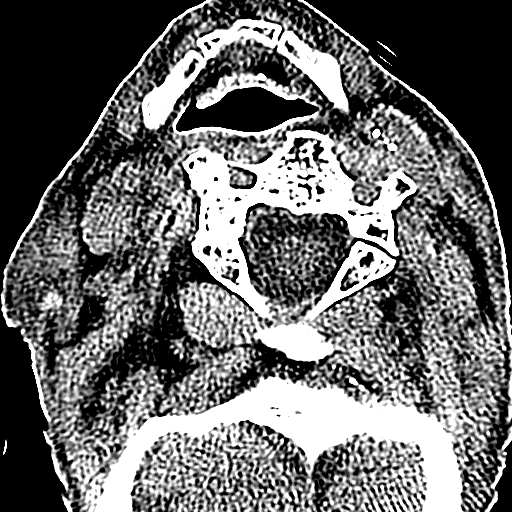
[im 57/73  brain]
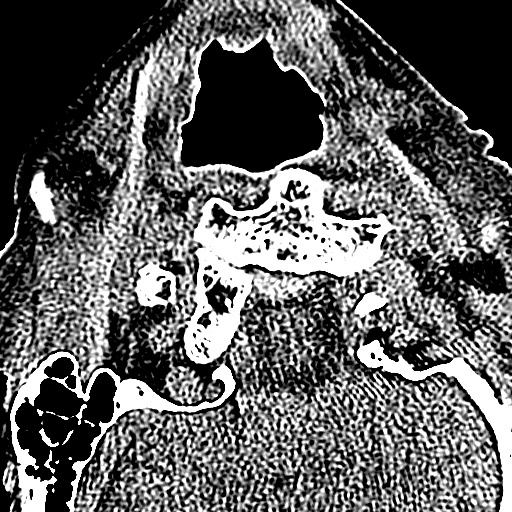
[im 65/73  brain]
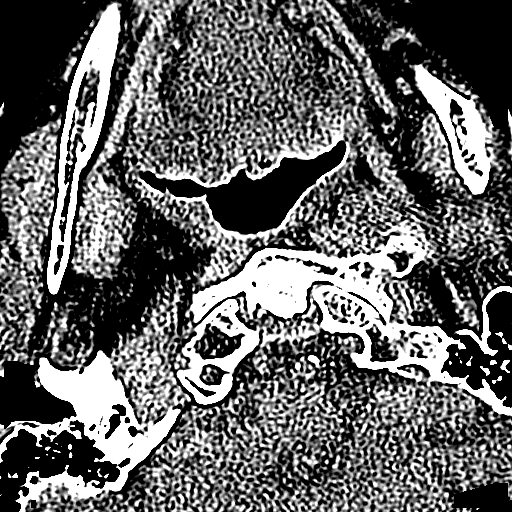

[Series 12: coronal · coronal · 0.23mm/px · 2 of 61 slices shown]
[im 33/61  brain]
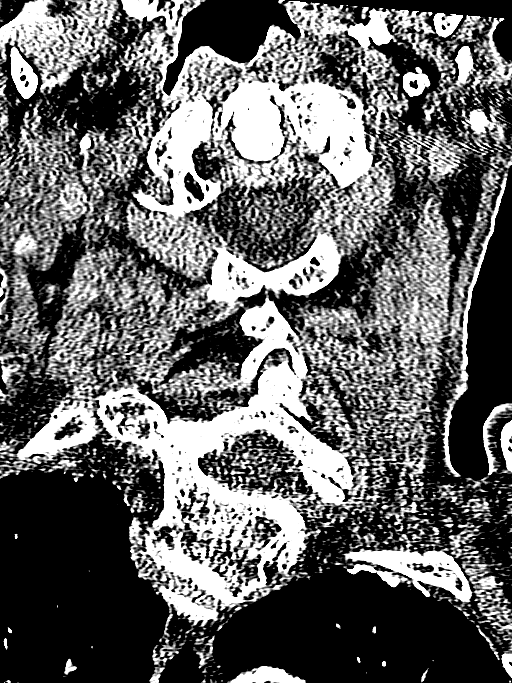
[im 47/61  brain]
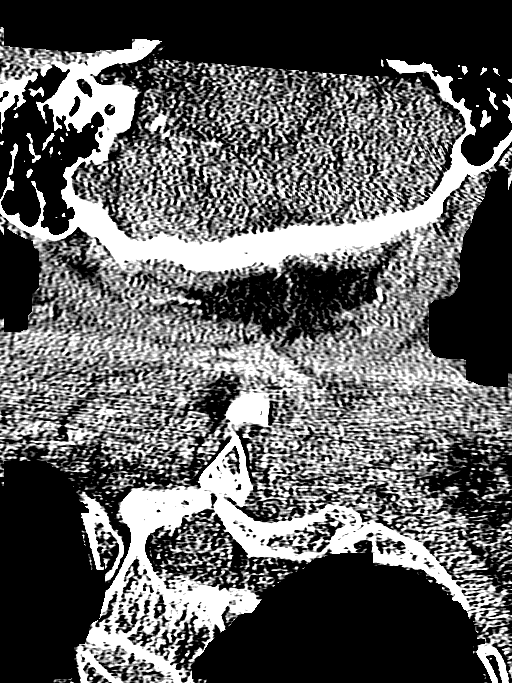

[17 of 47 positions shown; findings below may reference images not displayed]

FINDINGS: CT HEAD FINDINGS

The ventricles and sulci are mildly prominent compatible with mild
age-related atrophy. Mild moderate periventricular and deep white
matter chronic microvascular ischemic changes noted. There is no
acute intracranial hemorrhage. No mass effect or midline shift
noted.

The visualized paranasal sinuses and mastoid air cells are clear.
The calvarium is intact.

CT CERVICAL SPINE FINDINGS

There is no acute fracture or subluxation of the cervical
spine.There is osteopenia with multilevel degenerative changes of
the spine.The odontoid and spinous processes are intact.There is
normal anatomic alignment of the C1-C2 lateral masses. The
visualized soft tissues appear unremarkable.

Partially visualized right and probable left pleural effusions.
There is biapical scarring with increased interstitial septal
prominence likely a degree of edema.
IMPRESSION: No acute intracranial hemorrhage.

No acute/ traumatic cervical spine pathology.

## 2017-12-05 IMAGING — CR DG HIP (WITH OR WITHOUT PELVIS) 2-3V*L*
3 series · 3 of 3 positions shown · non-contrast
Comparison: None.

CLINICAL DATA: Status post fall, with left hip pain. Initial
encounter.

EXAM:
DG HIP (WITH OR WITHOUT PELVIS) 2-3V LEFT

[x pelvis]
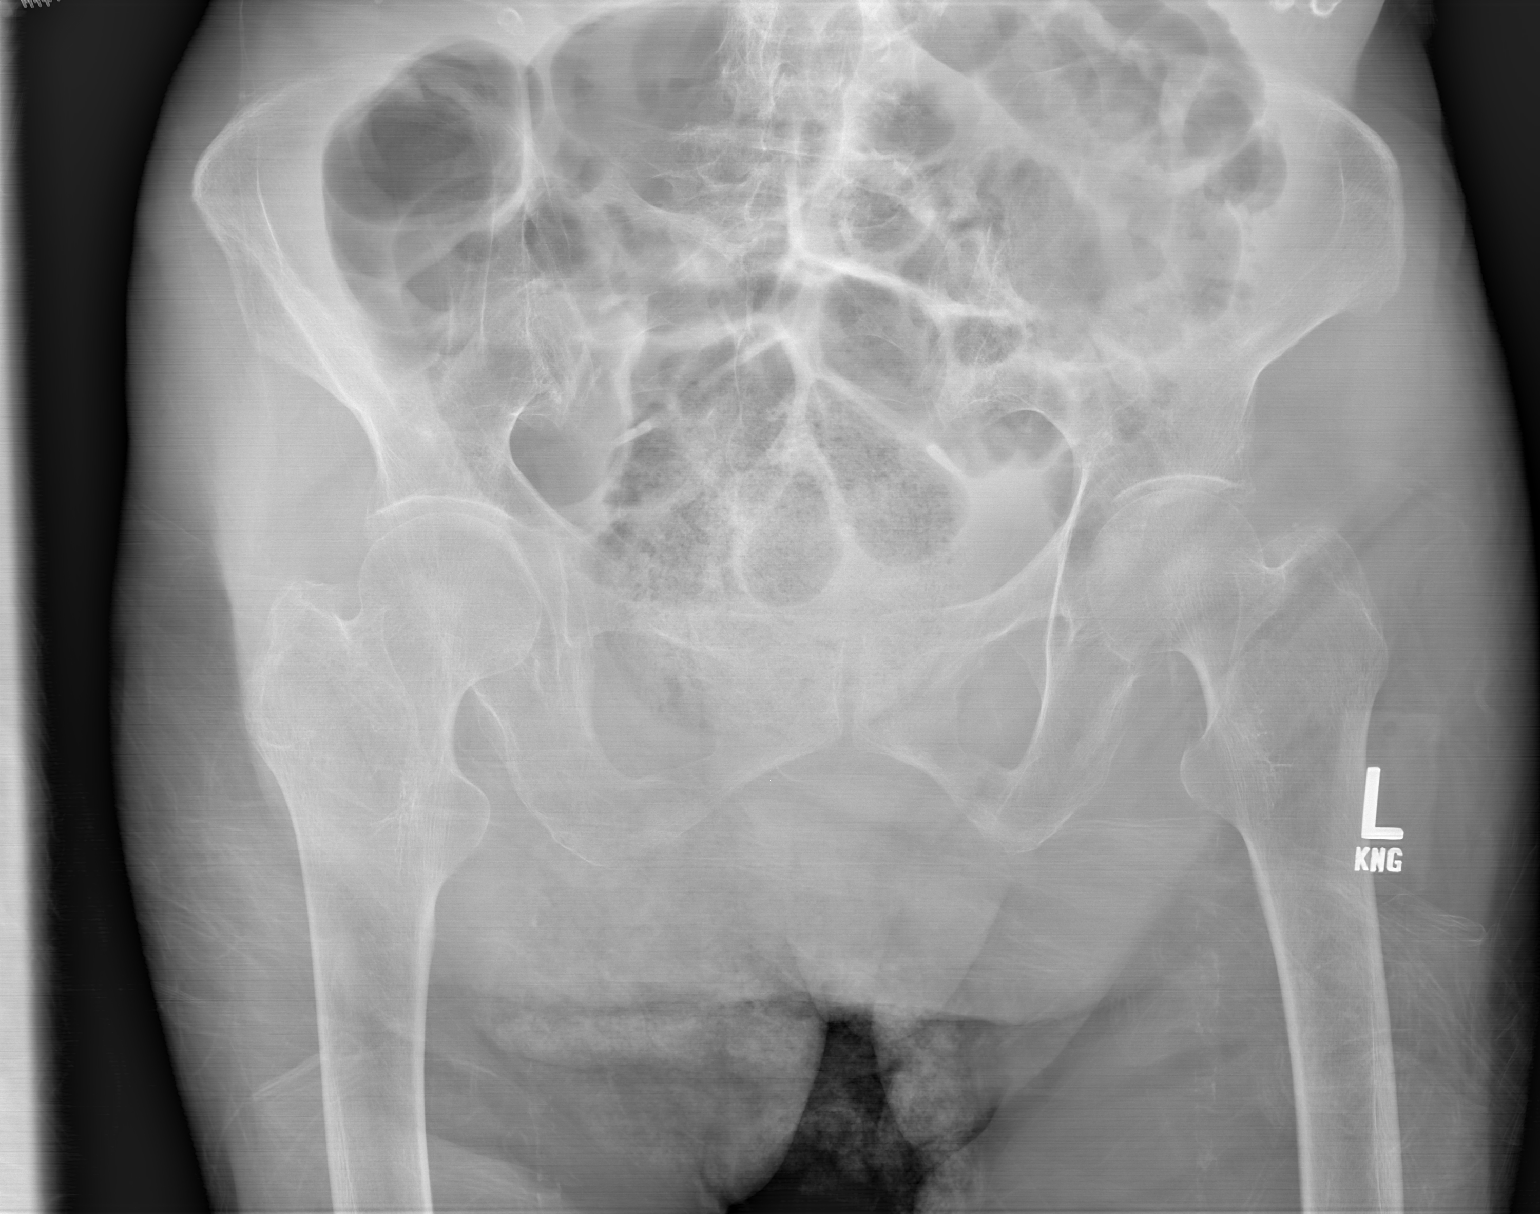

[x hip ap left]
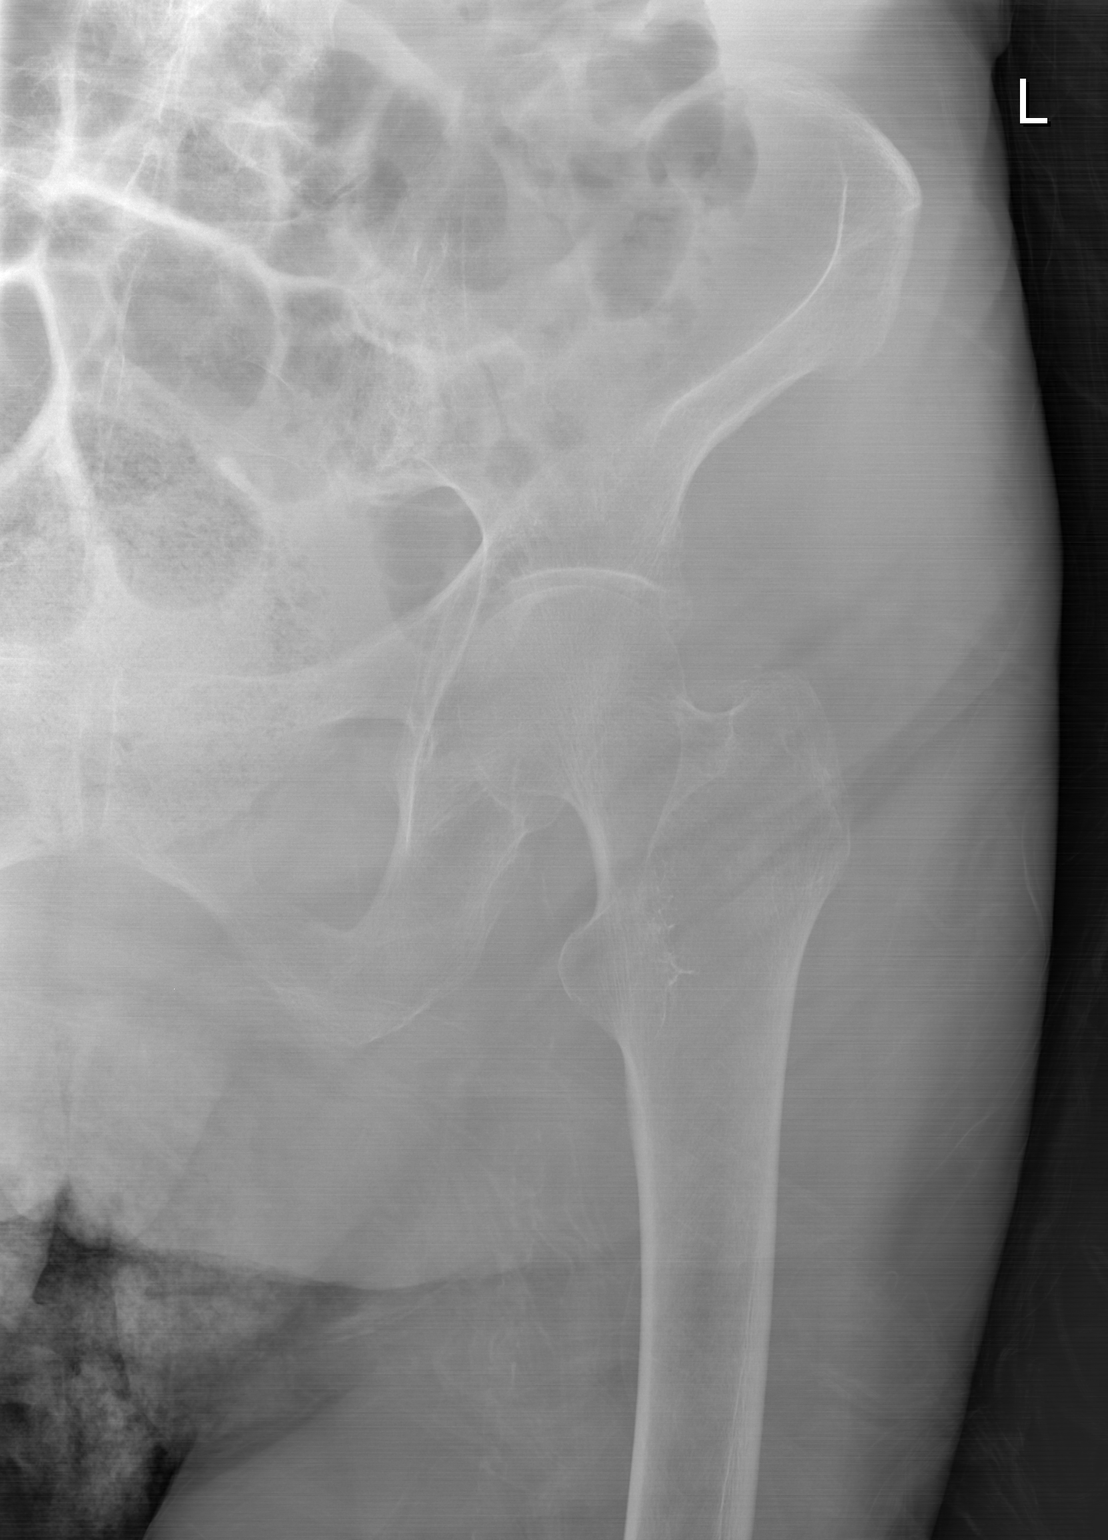

[x hip lat left]
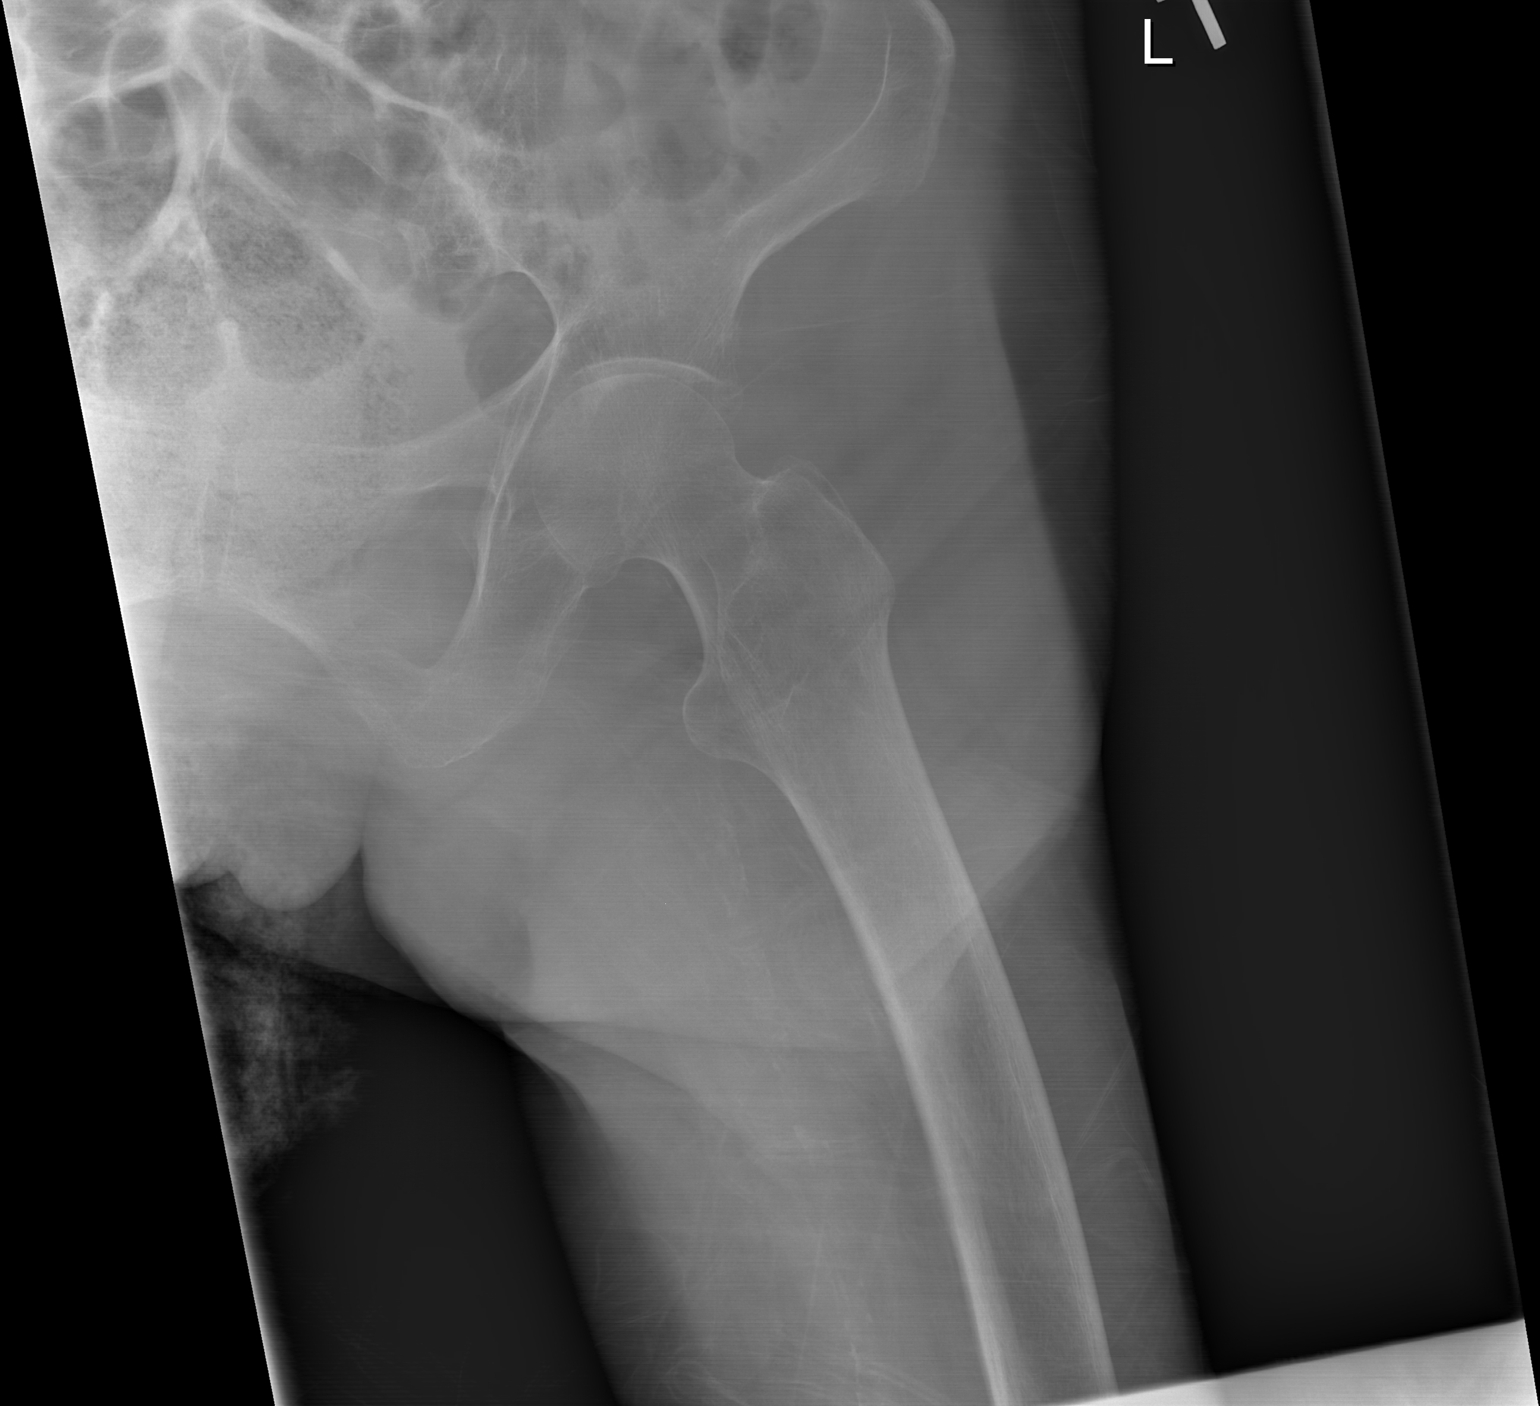

[3 of 3 positions shown; findings below may reference images not displayed]

FINDINGS: There is no evidence of fracture or dislocation. Both femoral heads
are seated normally within their respective acetabula. The proximal
left femur appears intact. No significant degenerative change is
appreciated. The sacroiliac joints are unremarkable in appearance.

The visualized bowel gas pattern is grossly unremarkable in
appearance.
IMPRESSION: No evidence of fracture or dislocation.

## 2017-12-06 IMAGING — DX DG CHEST 1V
1 series · 1 of 1 positions shown · non-contrast
Comparison: Chest x-ray dated 03/03/2016.

CLINICAL DATA: Oxygen deficiency today.

EXAM:
CHEST 1 VIEW

[chest ap]
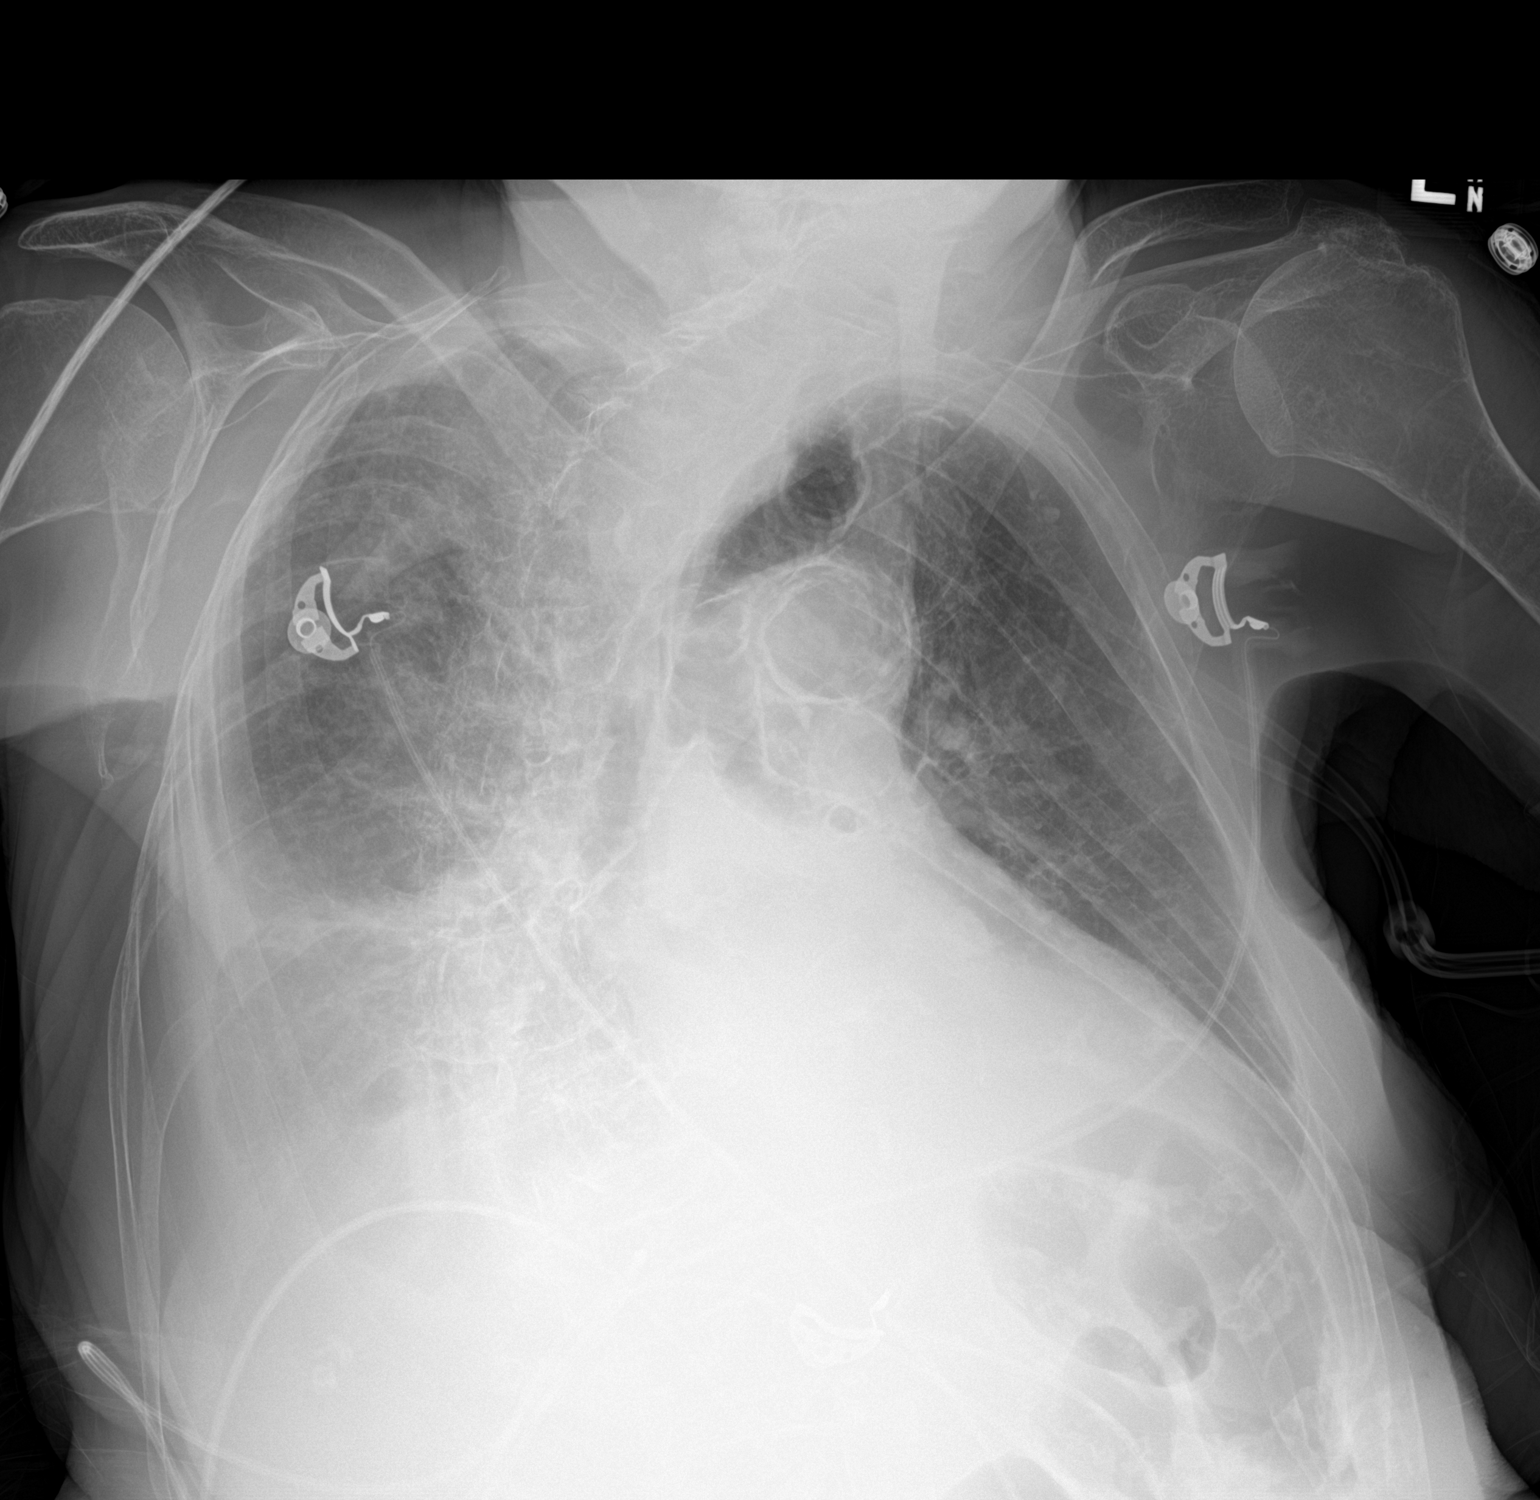

[1 of 1 positions shown; findings below may reference images not displayed]

FINDINGS: Comparison to yesterday's x-ray is limited by patient rotation.
Cardiomediastinal silhouette appears grossly stable in size and
configuration. Dense opacity again noted at the left lung base.
Ill-defined opacity at the right lung base is likely a combination
of pleural effusion and consolidation. Overall, no significant
change. Atherosclerotic changes again noted at the aortic arch.
IMPRESSION: 1. No significant change compared to yesterday's chest x-ray.
Bibasilar opacities, left greater than right, could represent
pneumonia or atelectasis/airspace collapse. Right pleural effusion
appears stable, small to moderate in size.
2. Aortic atherosclerosis.

## 2018-04-24 ENCOUNTER — Encounter: Payer: Self-pay | Admitting: Internal Medicine
